# Patient Record
Sex: Female | Born: 1965 | Race: White | Hispanic: No | Marital: Married | Smoking: Never smoker
Health system: Southern US, Community
[De-identification: ages and names within clinical notes are randomized; demographics above are authoritative.]

## PROBLEM LIST (undated history)

## (undated) DIAGNOSIS — F419 Anxiety disorder, unspecified: Secondary | ICD-10-CM

## (undated) DIAGNOSIS — F32A Depression, unspecified: Secondary | ICD-10-CM

## (undated) DIAGNOSIS — T7840XA Allergy, unspecified, initial encounter: Secondary | ICD-10-CM

## (undated) DIAGNOSIS — E119 Type 2 diabetes mellitus without complications: Secondary | ICD-10-CM

## (undated) DIAGNOSIS — Z8042 Family history of malignant neoplasm of prostate: Secondary | ICD-10-CM

## (undated) DIAGNOSIS — I10 Essential (primary) hypertension: Secondary | ICD-10-CM

## (undated) DIAGNOSIS — Z803 Family history of malignant neoplasm of breast: Secondary | ICD-10-CM

## (undated) DIAGNOSIS — Z8049 Family history of malignant neoplasm of other genital organs: Secondary | ICD-10-CM

## (undated) DIAGNOSIS — F329 Major depressive disorder, single episode, unspecified: Secondary | ICD-10-CM

## (undated) DIAGNOSIS — G4730 Sleep apnea, unspecified: Secondary | ICD-10-CM

## (undated) DIAGNOSIS — Z808 Family history of malignant neoplasm of other organs or systems: Secondary | ICD-10-CM

## (undated) DIAGNOSIS — R7303 Prediabetes: Secondary | ICD-10-CM

## (undated) DIAGNOSIS — E785 Hyperlipidemia, unspecified: Secondary | ICD-10-CM

## (undated) DIAGNOSIS — G43909 Migraine, unspecified, not intractable, without status migrainosus: Secondary | ICD-10-CM

## (undated) DIAGNOSIS — K219 Gastro-esophageal reflux disease without esophagitis: Secondary | ICD-10-CM

## (undated) HISTORY — DX: Family history of malignant neoplasm of other genital organs: Z80.49

## (undated) HISTORY — PX: CHOLECYSTECTOMY: SHX55

## (undated) HISTORY — PX: TUBAL LIGATION: SHX77

## (undated) HISTORY — DX: Depression, unspecified: F32.A

## (undated) HISTORY — DX: Prediabetes: R73.03

## (undated) HISTORY — DX: Major depressive disorder, single episode, unspecified: F32.9

## (undated) HISTORY — DX: Allergy, unspecified, initial encounter: T78.40XA

## (undated) HISTORY — PX: COLONOSCOPY: SHX174

## (undated) HISTORY — DX: Sleep apnea, unspecified: G47.30

## (undated) HISTORY — DX: Gastro-esophageal reflux disease without esophagitis: K21.9

## (undated) HISTORY — DX: Family history of malignant neoplasm of breast: Z80.3

## (undated) HISTORY — DX: Family history of malignant neoplasm of prostate: Z80.42

## (undated) HISTORY — PX: CARPAL TUNNEL RELEASE: SHX101

## (undated) HISTORY — DX: Family history of malignant neoplasm of other organs or systems: Z80.8

## (undated) HISTORY — PX: FOOT BONE EXCISION: SUR493

## (undated) HISTORY — DX: Migraine, unspecified, not intractable, without status migrainosus: G43.909

## (undated) HISTORY — DX: Hyperlipidemia, unspecified: E78.5

## (undated) HISTORY — DX: Type 2 diabetes mellitus without complications: E11.9

---

## 1999-01-25 ENCOUNTER — Other Ambulatory Visit: Admission: RE | Admit: 1999-01-25 | Discharge: 1999-01-25 | Payer: Self-pay | Admitting: Obstetrics and Gynecology

## 1999-08-09 ENCOUNTER — Other Ambulatory Visit: Admission: RE | Admit: 1999-08-09 | Discharge: 1999-08-09 | Payer: Self-pay | Admitting: Podiatry

## 2000-02-15 ENCOUNTER — Other Ambulatory Visit: Admission: RE | Admit: 2000-02-15 | Discharge: 2000-02-15 | Payer: Self-pay | Admitting: Obstetrics and Gynecology

## 2001-05-21 ENCOUNTER — Other Ambulatory Visit: Admission: RE | Admit: 2001-05-21 | Discharge: 2001-05-21 | Payer: Self-pay | Admitting: Obstetrics and Gynecology

## 2003-11-16 ENCOUNTER — Encounter: Admission: RE | Admit: 2003-11-16 | Discharge: 2003-11-16 | Payer: Self-pay | Admitting: Family Medicine

## 2004-06-06 ENCOUNTER — Other Ambulatory Visit: Admission: RE | Admit: 2004-06-06 | Discharge: 2004-06-06 | Payer: Self-pay | Admitting: Obstetrics and Gynecology

## 2006-02-02 ENCOUNTER — Other Ambulatory Visit: Admission: RE | Admit: 2006-02-02 | Discharge: 2006-02-02 | Payer: Self-pay | Admitting: Obstetrics and Gynecology

## 2009-04-30 ENCOUNTER — Encounter: Admission: RE | Admit: 2009-04-30 | Discharge: 2009-04-30 | Payer: Self-pay | Admitting: Family Medicine

## 2011-08-22 ENCOUNTER — Ambulatory Visit: Payer: Self-pay | Admitting: Dietician

## 2011-09-01 ENCOUNTER — Encounter: Payer: 59 | Attending: Family Medicine | Admitting: Dietician

## 2011-09-01 ENCOUNTER — Encounter: Payer: Self-pay | Admitting: Dietician

## 2011-09-01 DIAGNOSIS — E119 Type 2 diabetes mellitus without complications: Secondary | ICD-10-CM | POA: Insufficient documentation

## 2011-09-01 DIAGNOSIS — E7800 Pure hypercholesterolemia, unspecified: Secondary | ICD-10-CM | POA: Insufficient documentation

## 2011-09-01 DIAGNOSIS — E669 Obesity, unspecified: Secondary | ICD-10-CM | POA: Insufficient documentation

## 2011-09-01 DIAGNOSIS — Z713 Dietary counseling and surveillance: Secondary | ICD-10-CM | POA: Insufficient documentation

## 2011-09-01 NOTE — Patient Instructions (Signed)
   Try to walk for 30 minutes on Saturday and Sunday when you are off.  When you have adjusted to your new schedule, aim to add some walking time into the weekdays.  Try to do the whole grain breads and cereals.    Read food labels and aim for 2 gm of fiber in bread servings and 3 gm of fiber in your cereals, rice and pasta.  Keep the sugar level in cereals 0-9 gms.  Monitor fat portions and use the amounts suggested on the label.  Increase your intake of non-starchy vegetables.    Make the meats and proteins lean.  Mealtime servings are the size of the palm of your hand.  Continue to bake, broil and avoid frying foods.  Try to limit carbohydrates to 1 serving for snacks and 3 servings at meals or 45 gm.  Use the exchange list and the food label for counting.  Plan to have a protein serving at all meals and snacks. (Snack can be 1-2 oz of protein)  Monitor blood glucose levels daily (Yariana Hoaglund have to skip a couple of days since you do not have but 9 strips.  Call your insurance company and see what their preferred meter is.    If the preferred meter is not the Accu-Chek, then find out the preferred brand and check with Maggie to see if she has that meter.  When you see Dr. Doristine Counter, take the meter and your glucose log.  Plan to get him to write you a prescription for glucose strips and lancets.  Call if you have questions ([phone])    Follow-up in 8-12 weeks with me.  Call [phone] to set-up an appointment.

## 2011-09-01 NOTE — Progress Notes (Signed)
  Medical Nutrition Therapy:  Appt start time: 0830 end time:  1000.  Assessment:  Primary concerns today: new onset type 2 diabetes.  HgA1C is 6.8% on 06/22/2011.  Has strong family history of diabetes (both parents)  Currently on no diabetes medications but is trying to loose weight.  Has lost 9.4 lbs since her last appointment with MD.  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  She has given up her regular soda but is having difficulty giving up her sweetened tea.  Her allergies are food in nature.  They are rosemary and Aspartane.  She gets sever headaches when she uses the Aspartane.   MEDICATIONS: See review  DIETARY INTAKE:  24-hr recall:  B (8:30  AM): biscuit and 2 slices of bacon and glass of sweet tea  Snk (AM) :None Sleeps during the day.  D (6:30-7:00 PM): Grill meat such as chicken (3 oz), mac and cheese (1/2 cup), squash and onions (1/2 cup) and sweet tea (8 oz). L (4:00 AM): Left-overs from dinner at home.  Chicken 3 oz, mac and cheese 1/4 cup and Squash nd onion 1/2 cup, and 4-6 oz of sweet tea.  Snk (2-2:30 PM): Water of sometimes some fruit.  Recent physical activity: Currently no regular exercise plan.  Encouraged her to start at least walking 30 minutes on her day off.  Estimated energy needs: 1300-1400 calories 150-155 g carbohydrates 100-105 g protein 36-38 g fat  Progress Towards Goal(s):  In progress.   Nutritional Diagnosis:  [place]-2.1 Inpaired nutrition utilization As related to glucose.  As evidenced by diagnosis of type 2 diabetes and HgA1C of 6.8%.    Intervention:  Nutrition Currently adjusting to the new working schedule and its change on her lifestyle.  It has impacted on her diet in that she is less hungry, and finds herself nauseated in the early morning hours at work and eats less at the dinner break.  Handouts given during visit include:  Diabetes Type 2, What it is and basic  self-management measures.  Yellow card with dietary prescription.  Novo Nordisk Carb counting guide  Self-management guide for diet, exercise, glucose monitoring.  Monitoring/Evaluation:  Dietary intake, exercise, blood glucose levels, and body weight 4-8 weeks after appointment with MD.

## 2012-12-25 ENCOUNTER — Other Ambulatory Visit: Payer: Self-pay | Admitting: Emergency Medicine

## 2012-12-25 ENCOUNTER — Ambulatory Visit
Admission: RE | Admit: 2012-12-25 | Discharge: 2012-12-25 | Disposition: A | Discharge status: Home / Self Care | Payer: Self-pay | Source: Ambulatory Visit | Attending: Emergency Medicine | Admitting: Emergency Medicine

## 2012-12-25 DIAGNOSIS — R52 Pain, unspecified: Secondary | ICD-10-CM

## 2012-12-25 DIAGNOSIS — W19XXXA Unspecified fall, initial encounter: Secondary | ICD-10-CM

## 2013-11-21 ENCOUNTER — Emergency Department (HOSPITAL_COMMUNITY)
Admission: EM | Admit: 2013-11-21 | Discharge: 2013-11-21 | Disposition: A | Discharge status: Home / Self Care | Payer: Worker's Compensation | Attending: Emergency Medicine | Admitting: Emergency Medicine

## 2013-11-21 ENCOUNTER — Emergency Department (HOSPITAL_COMMUNITY): Payer: Worker's Compensation

## 2013-11-21 ENCOUNTER — Encounter (HOSPITAL_COMMUNITY): Payer: Self-pay | Admitting: Emergency Medicine

## 2013-11-21 DIAGNOSIS — S42202A Unspecified fracture of upper end of left humerus, initial encounter for closed fracture: Secondary | ICD-10-CM

## 2013-11-21 DIAGNOSIS — S42209A Unspecified fracture of upper end of unspecified humerus, initial encounter for closed fracture: Secondary | ICD-10-CM | POA: Insufficient documentation

## 2013-11-21 DIAGNOSIS — S59909A Unspecified injury of unspecified elbow, initial encounter: Secondary | ICD-10-CM | POA: Insufficient documentation

## 2013-11-21 DIAGNOSIS — Y9289 Other specified places as the place of occurrence of the external cause: Secondary | ICD-10-CM | POA: Insufficient documentation

## 2013-11-21 DIAGNOSIS — W010XXA Fall on same level from slipping, tripping and stumbling without subsequent striking against object, initial encounter: Secondary | ICD-10-CM | POA: Insufficient documentation

## 2013-11-21 DIAGNOSIS — Z79899 Other long term (current) drug therapy: Secondary | ICD-10-CM | POA: Insufficient documentation

## 2013-11-21 DIAGNOSIS — S6990XA Unspecified injury of unspecified wrist, hand and finger(s), initial encounter: Secondary | ICD-10-CM | POA: Insufficient documentation

## 2013-11-21 DIAGNOSIS — Y9389 Activity, other specified: Secondary | ICD-10-CM | POA: Insufficient documentation

## 2013-11-21 DIAGNOSIS — Y990 Civilian activity done for income or pay: Secondary | ICD-10-CM | POA: Insufficient documentation

## 2013-11-21 HISTORY — DX: Anxiety disorder, unspecified: F41.9

## 2013-11-21 HISTORY — DX: Essential (primary) hypertension: I10

## 2013-11-21 MED ORDER — OXYCODONE-ACETAMINOPHEN 5-325 MG PO TABS: 2.0000 | ORAL_TABLET | ORAL | Status: DC | PRN

## 2013-11-21 MED ORDER — OXYCODONE-ACETAMINOPHEN 5-325 MG PO TABS
2.0000 | ORAL_TABLET | Freq: Once | ORAL | Status: AC
  Administered 2013-11-21: 2 via ORAL
  Filled 2013-11-21: qty 2

## 2013-11-21 MED ORDER — MORPHINE SULFATE 4 MG/ML IJ SOLN: 4.0000 mg | INTRAMUSCULAR | Status: DC | PRN

## 2013-11-21 MED ORDER — MORPHINE SULFATE 4 MG/ML IJ SOLN: 4.0000 mg | Freq: Once | INTRAMUSCULAR | Status: DC

## 2013-11-21 MED ORDER — ONDANSETRON 4 MG PO TBDP: 4.0000 mg | ORAL_TABLET | Freq: Three times a day (TID) | ORAL | Status: DC | PRN

## 2013-11-21 MED ORDER — ONDANSETRON HCL 4 MG PO TABS
4.0000 mg | ORAL_TABLET | Freq: Once | ORAL | Status: AC
  Administered 2013-11-21: 4 mg via ORAL
  Filled 2013-11-21: qty 1

## 2013-11-21 MED ORDER — ONDANSETRON HCL 4 MG/2ML IJ SOLN: 4.0000 mg | Freq: Once | INTRAMUSCULAR | Status: DC

## 2013-11-21 MED ORDER — MORPHINE SULFATE 4 MG/ML IJ SOLN
8.0000 mg | Freq: Once | INTRAMUSCULAR | Status: AC
  Administered 2013-11-21: 8 mg via INTRAMUSCULAR
  Filled 2013-11-21: qty 2

## 2013-11-21 MED ORDER — MECLIZINE HCL 50 MG PO TABS: 50.0000 mg | ORAL_TABLET | Freq: Three times a day (TID) | ORAL | Status: DC | PRN

## 2013-11-21 NOTE — ED Notes (Signed)
Patient transported to X-ray via stretcher 

## 2013-11-21 NOTE — Consult Note (Signed)
ORTHOPAEDIC CONSULTATION  REQUESTING PHYSICIAN: Emergency room physician  Chief Complaint: Left arm pain  HPI: Morgan Rivera is a 47 y.o. female who complains of  left arm pain after a mechanical fall. She said she just tripped and fell. No other injuries. Acute severe pain of the left shoulder, better with IV medications, worse with movement. This occurred today.  Past Medical History  Diagnosis Date  . Hypertension   . Anxiety    Past Surgical History  Procedure Laterality Date  . Cholecystectomy    . Carpal tunnel release    . Foot bone excision     History   Social History  . Marital Status: Married    Spouse Name: N/A    Number of Children: N/A  . Years of Education: N/A   Social History Main Topics  . Smoking status: Never Smoker   . Smokeless tobacco: Never Used  . Alcohol Use: No  . Drug Use: No  . Sexual Activity: Yes    Birth Control/ Protection: Surgical   Other Topics Concern  . None   Social History Narrative  . None   History reviewed. No pertinent family history. Allergies  Allergen Reactions  . Food     Rosemary, scratchy sore throat    . Hydrocodone     Vertigo, nausea, vomiting   Prior to Admission medications   Medication Sig Start Date End Date Taking? Authorizing Provider  citalopram (CELEXA) 40 MG tablet Take 40 mg by mouth daily.   Yes Historical Provider, MD  quinapril (ACCUPRIL) 40 MG tablet Take 40 mg by mouth at bedtime.     Yes Historical Provider, MD  meclizine (ANTIVERT) 50 MG tablet Take 1 tablet (50 mg total) by mouth 3 (three) times daily as needed. 11/21/13   Roney Marion, MD  ondansetron (ZOFRAN ODT) 4 MG disintegrating tablet Take 1 tablet (4 mg total) by mouth every 8 (eight) hours as needed for nausea. 11/21/13   Roney Marion, MD  oxyCODONE-acetaminophen (PERCOCET/ROXICET) 5-325 MG per tablet Take 2 tablets by mouth every 4 (four) hours as needed. 11/21/13   Roney Marion, MD   Dg Shoulder Left  11/21/2013   CLINICAL  DATA:  Fall  EXAM: LEFT SHOULDER - 2+ VIEW  COMPARISON:  Concomitant radiograph of the left humerus.  FINDINGS: Acute comminuted fracture of the proximal left humeral shaft is partially visualized, better evaluated on concomitant radiograph of the left humerus. The humeral head remains in normal alignment with the glenoid. AC joint is approximated. Small periarticular calcifications seen at the superior aspect of the humeral head may represent sequelae of calcific tendinosis.  IMPRESSION: Comminuted fracture of the proximal left humeral shaft, better evaluated on concomitant radiograph of the left humerus. No shoulder dislocation.   Electronically Signed   By: Rise Mu M.D.   On: 11/21/2013 06:21   Dg Humerus Left  11/21/2013   CLINICAL DATA:  Fall  EXAM: LEFT HUMERUS - 2+ VIEW  COMPARISON:  Concomitant radiograph of the left shoulder.  FINDINGS: There is a comminuted fracture of the proximal left humeral shaft with slight medial and posterior displacement. Diffuse soft tissue swelling is present. No radiopaque foreign body. Osseous mineralization is normal.  IMPRESSION: Comminuted mildly displaced fracture of the proximal left humeral shaft.   Electronically Signed   By: Rise Mu M.D.   On: 11/21/2013 06:23    Positive ROS: All other systems have been reviewed and were otherwise negative with the exception of those  mentioned in the HPI and as above.  Physical Exam: General: Alert, no acute distress Cardiovascular: No pedal edema, positive obesity. Respiratory: No cyanosis, no use of accessory musculature GI: No organomegaly, abdomen is soft and non-tender Skin: No lesions in the area of chief complaint with the exception of bruising per report. She is in a coaptation splint now. Neurologic: Sensation intact distally, mild decreased sensation on the tip of the index finger, sensation intact the rest of the hand. Psychiatric: Patient is competent for consent with normal mood and  affect Lymphatic: No axillary or cervical lymphadenopathy  MUSCULOSKELETAL: Left hand has extension intact, all fingers flex extend and abduct. Positive pain to palpation of the proximal humerus. No pain of the elbow.  Assessment: Left proximal humeral shaft fracture, minimally displaced, with coexisting obesity.  Plan: The obesity makes it more challenging to maintain adequate reduction, however the degree of displacement is currently acceptable, and I recommended closed treatment. Victorio Palm, orthopedic PA-C has assisted the cast techs with the application of a coaptation splint. She will have Percocet for pain going home, and I'll plan to see her again in one week. This is an acute severe injury that could take probably at least for 6 months to recover from, and I will monitor the position of the fracture closely over the course of the next couple of weeks, to make sure that maintain alignment. If not then she may need surgical intervention.     Eulas Post, MD Cell [phone]   11/21/2013 9:26 AM

## 2013-11-21 NOTE — ED Provider Notes (Signed)
CSN: [id number]     Arrival date & time 11/21/13  0451 History   First MD Initiated Contact with Patient 11/21/13 0451     Chief Complaint  Patient presents with  . Fall   (Consider location/radiation/quality/duration/timing/severity/associated sxs/prior Treatment) Patient is a 47 y.o. female presenting with fall. The history is provided by the patient.  Fall   patient here complaining of severe left shoulder pain after tripping and falling this morning. No loss of consciousness. Denies any head or neck injury. Complains of severe sharp pain worse with movement to her leftt anterior and posterior shoulder. Also notes a soreness to the left wrist and forearm and elbow. No numbness or tingling or neurological deficits of the left side. Called EMS and was given Zofran for nausea and transported here.  No past medical history on file. No past surgical history on file. No family history on file. History  Substance Use Topics  . Smoking status: Never Smoker   . Smokeless tobacco: Never Used  . Alcohol Use: No   OB History   Grav Para Term Preterm Abortions TAB SAB Ect Mult Living                 Review of Systems  All other systems reviewed and are negative.    Allergies  Food  Home Medications   Current Outpatient Rx  Name  Route  Sig  Dispense  Refill  . alprazolam (XANAX) 2 MG tablet   Oral   Take 2 mg by mouth at bedtime as needed.           . quinapril (ACCUPRIL) 40 MG tablet   Oral   Take 40 mg by mouth at bedtime.           . rosuvastatin (CRESTOR) 20 MG tablet   Oral   Take 20 mg by mouth daily.            There were no vitals taken for this visit. Physical Exam  Nursing note and vitals reviewed. Constitutional: She is oriented to person, place, and time. She appears well-developed and well-nourished.  Non-toxic appearance. No distress.  HENT:  Head: Normocephalic and atraumatic.  Eyes: Conjunctivae, EOM and lids are normal. Pupils are equal, round,  and reactive to light.  Neck: Normal range of motion. Neck supple. No tracheal deviation present. No mass present.  Cardiovascular: Normal rate, regular rhythm and normal heart sounds.  Exam reveals no gallop.   No murmur heard. Pulmonary/Chest: Effort normal and breath sounds normal. No stridor. No respiratory distress. She has no decreased breath sounds. She has no wheezes. She has no rhonchi. She has no rales.  Abdominal: Soft. Normal appearance and bowel sounds are normal. She exhibits no distension. There is no tenderness. There is no rebound and no CVA tenderness.  Musculoskeletal: Normal range of motion. She exhibits no edema and no tenderness.       Left shoulder: She exhibits bony tenderness, swelling and pain.       Arms: Neurological: She is alert and oriented to person, place, and time. She has normal strength. No cranial nerve deficit or sensory deficit. GCS eye subscore is 4. GCS verbal subscore is 5. GCS motor subscore is 6.  Skin: Skin is warm and dry. No abrasion and no rash noted.  Psychiatric: She has a normal mood and affect. Her speech is normal and behavior is normal.    ED Course  Procedures (including critical care time) Labs Review Labs Reviewed - No  data to display Imaging Review No results found.  EKG Interpretation   None       MDM  No diagnosis found. Spoke with dr. Dion Saucier, will come to see this am--pain meds given,    Toy Baker, MD 11/21/13 [phone]

## 2013-11-21 NOTE — ED Notes (Signed)
Ortho PA at bedside.  

## 2013-11-21 NOTE — ED Notes (Signed)
Fell at work landing on left arm.  C/O left shoulder area pain with even slightest movement.  Denies any other injuries

## 2013-11-21 NOTE — Discharge Instructions (Signed)
Humerus Fracture, Treated with Immobilization °The humerus is the large bone in the upper arm. A broken (fractured) humerus is often treated by wearing a cast, splint, or sling (immobilization). This holds the broken pieces in place so they can heal.  °HOME CARE °· Put ice on the injured area. °· Put ice in a plastic bag. °· Place a towel between your skin and the bag. °· Leave the ice on for 15-20 minutes, 03-04 times a day. °· If you are given a cast: °· Do not scratch the skin under the cast. °· Check the skin around the cast every day. You may put lotion on any red or sore areas. °· Keep the cast dry and clean. °· If you are given a splint: °· Wear the splint as told. °· Keep the splint clean and dry. °· Loosen the elastic around the splint if your fingers become numb, cold, tingle, or turn blue. °· If you are given a sling: °· Wear the sling as told. °· Do not put pressure on any part of the cast or splint until it is fully hardened. °· The cast or splint must be protected with a plastic bag during bathing. Do not lower the cast or splint into water. °· Only take medicine as told by your doctor. °· Do exercises as told by your doctor. °· Follow up as told by your doctor. °GET HELP RIGHT AWAY IF:  °· Your skin or fingernails turn blue or gray. °· Your arm feels cold or numb. °· You have very bad pain in the injured arm. °· You are having problems with the medicines you were given. °MAKE SURE YOU:  °· Understand these instructions. °· Will watch your condition. °· Will get help right away if you are not doing well or get worse. °Document Released: 05/22/2008 Document Revised: 02/26/2012 Document Reviewed: 01/18/2011 °ExitCare® Patient Information ©2014 [employer]. ° °

## 2014-05-06 ENCOUNTER — Other Ambulatory Visit (INDEPENDENT_AMBULATORY_CARE_PROVIDER_SITE_OTHER): Payer: Self-pay

## 2014-05-06 ENCOUNTER — Encounter (INDEPENDENT_AMBULATORY_CARE_PROVIDER_SITE_OTHER): Payer: Self-pay | Admitting: Surgery

## 2014-05-06 ENCOUNTER — Ambulatory Visit (INDEPENDENT_AMBULATORY_CARE_PROVIDER_SITE_OTHER): Payer: 59 | Admitting: Surgery

## 2014-05-06 NOTE — Progress Notes (Signed)
Re:   Morgan QuinceSandra C Vaca DOB:   17-Jun-1966 MRN:   [id number]  ASSESSMENT AND PLAN: 1.  Morbid obesity  Initial weight - 265, BMI - 50.18  Per the 1991 NIH Consensus Statement, the patient is a candidate for bariatric surgery.  The patient attended our initial information session and reviewed the types of bariatric surgery.    The patient is interested in the Roux en Y Gastric Bypass.  I discussed with the patient the indications and risks of bariatric surgery.  The potential risks of surgery include, but are not limited to, bleeding, infection, leak from the bowel, DVT and PE, open surgery, long term nutrition consequences, and death.  The patient understands the importance of compliance and long term follow-up with our group after surgery.  From here we will obtain lab tests, x-rays, nutrition consult, and psych consult.  2.  HTN 3.  Anxiety 4.  Sleep apnea 5.  Recovering from broken upper left arm. 6.  GERD  Chief Complaint  Patient presents with  . Bariatric Pre-op    Initial consult for gastric bypass   REFERRING PHYSICIAN: Millsaps, Joelene MillinKIMBERLY M, NP  [facility])  HISTORY OF PRESENT ILLNESS: Morgan Rivera is a 48 y.o. (DOB: 17-Jun-1966)  white  female whose primary care physician is Millsaps, Joelene MillinKIMBERLY M, NP and comes to me today for weight loss surgery. Her friend, Cristine PolioSteve Lamb, is with her.  She went to the online seminar for her information.  There is someone at work that she knows he's had a successful gastric bypass. 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 Cletus GashFoss Fernandez who did the gastric bypass surgery.  She has tried [employer], [facility] diet, Slim TRIM, counting calories, hold unsustained weight loss. She also tried medications Belviq and Qsymia without success.   Past Medical History  Diagnosis Date  . Hypertension   . Anxiety      Past Surgical History  Procedure Laterality Date  . Cholecystectomy    . Carpal tunnel release    .  Foot bone excision        Current Outpatient Prescriptions  Medication Sig Dispense Refill  . citalopram (CELEXA) 40 MG tablet Take 40 mg by mouth daily.      . meclizine (ANTIVERT) 50 MG tablet Take 1 tablet (50 mg total) by mouth 3 (three) times daily as needed.  30 tablet  0  . quinapril (ACCUPRIL) 40 MG tablet Take 40 mg by mouth at bedtime.        . ondansetron (ZOFRAN ODT) 4 MG disintegrating tablet Take 1 tablet (4 mg total) by mouth every 8 (eight) hours as needed for nausea.  20 tablet  0   No current facility-administered medications for this visit.     Allergies  Allergen Reactions  . Food     Rosemary, scratchy sore throat    . Hydrocodone     Vertigo, nausea, vomiting    REVIEW OF SYSTEMS: Skin:  No history of rash.  No history of abnormal moles. Infection:  No history of hepatitis or HIV.  No history of MRSA. Neurologic:  No history of stroke.  No history of seizure.  No history of headaches. Cardiac:  Hypertension. No history of heart disease.  No history of prior cardiac catheterization.  No history of seeing a cardiologist. Pulmonary:  [facility] x 4 years.  Dr. Morrie Sheldonay at [place] sees her.   Does not smoke cigarettes.  No asthma or  bronchitis.    Endocrine:  Pre diabetic.  She said the her HgbA1C is 6.7.  No thyroid disease. Gastrointestinal:  No history of stomach disease. Has GERD.  No history of liver disease.  History of cholecystectomy in 2006 in [place].   No history of pancreas disease.  No history of colon disease.  She did [place] in about [place], but does not know who did it. Urologic:  No history of kidney stones.  No history of bladder infections. GYN:  Sees Dr. Myrene GalasJ. Tomblin.  She has had a tubal ligation and laparoscopy for endometriosis Musculoskeletal:  Broke left upper arm.  Sees Dr.Landau.  Is out of work until September 2014. Hematologic:  No bleeding disorder.  No history of anemia.  Not anticoagulated. Psycho-social:  The patient is  oriented.   The patient has no obvious psychologic or social impairment to understanding our conversation and plan.  SOCIAL and FAMILY HISTORY: Not married, but almost. Cristine PolioSteve Lamb is with her. She has one daughter - 50 yo. 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  She sometimes has to lift 50 pounds.  [employer] is being sold to [employer] and she is worried about her job.    PHYSICAL EXAM: BP 138/82  Pulse 78  Temp(Src) 97.8 F (36.6 C) (Temporal)  Resp 16  Ht 5\' 1"  (1.549 m)  Wt 265 lb 9.6 oz (120.475 kg)  BMI 50.21 kg/m2  General: Obese WF who is alert and generally healthy appearing.  HEENT: Normal. Pupils equal. Neck: Supple. No mass.  No thyroid mass. Lymph Nodes:  No supraclavicular or cervical nodes. Lungs: Clear to auscultation and symmetric breath sounds. Heart:  RRR. No murmur or rub. Breasts:  Right:  Large, but no mass  Left:  Large, but no mass Abdomen: Soft. No mass. No tenderness. No hernia. Normal bowel sounds.  She has a large abdomen, but still about 1/2 apple and 1/2 pear. Rectal: No mass.  Guaiac neg. Extremities:  Good strength and ROM  in upper and lower extremities. Neurologic:  Grossly intact to motor and sensory function. Psychiatric: Has normal mood and affect. Behavior is normal.   DATA REVIEWED: Epic notes  Ovidio Kinavid Avira Tillison, MD,  [facility] [address]  [address]   [place], [place]    [id number] Phone:  [phone] FAX:  [phone]

## 2014-05-20 ENCOUNTER — Encounter: Payer: Self-pay | Admitting: Dietician

## 2014-05-20 ENCOUNTER — Encounter: Payer: 59 | Attending: Surgery | Admitting: Dietician

## 2014-05-20 DIAGNOSIS — Z6841 Body Mass Index (BMI) 40.0 and over, adult: Secondary | ICD-10-CM | POA: Insufficient documentation

## 2014-05-20 DIAGNOSIS — Z713 Dietary counseling and surveillance: Secondary | ICD-10-CM | POA: Insufficient documentation

## 2014-05-20 NOTE — Progress Notes (Signed)
  Pre-Op Assessment Visit:  Pre-Operative RYGB Surgery  Medical Nutrition Therapy:  Appt start time: 0800   End time:  0845.  Patient was seen on 05/20/2014 for Pre-Operative RYGB Nutrition Assessment. Assessment and letter of approval faxed to [facility] Bariatric Surgery Program coordinator on 05/20/2014.   Preferred Learning Style:  No preference indicated   Learning Readiness:   Ready   Handouts given during visit include:  Pre-Op Goals Bariatric Surgery Protein Shakes Phase 3A lean protein (per pt request)   Samples given and patient instructed on proper use:  Premier protein (chocolate - qty 1) Lot#: [id number] Exp: 12/2014  Unjury protein powder (unflavored - qty 1) Lot #: [id number] Exp: 03/2015  Teaching Method Utilized:  Visual Auditory Hands on  Barriers to learning/adherence to lifestyle change: none  Demonstrated degree of understanding via:  Teach Back   Patient to call the [facility] to enroll in Pre-Op and Post-Op Nutrition Education when surgery date is scheduled.

## 2014-05-22 ENCOUNTER — Other Ambulatory Visit (INDEPENDENT_AMBULATORY_CARE_PROVIDER_SITE_OTHER): Payer: Self-pay

## 2014-05-25 ENCOUNTER — Encounter (HOSPITAL_COMMUNITY)
Admission: RE | Disposition: A | Discharge status: Home / Self Care | Payer: 59 | Source: Ambulatory Visit | Attending: Surgery

## 2014-05-25 ENCOUNTER — Ambulatory Visit (HOSPITAL_COMMUNITY)
Admission: RE | Admit: 2014-05-25 | Discharge: 2014-05-25 | Disposition: A | Discharge status: Home / Self Care | Payer: 59 | Source: Ambulatory Visit | Attending: Surgery | Admitting: Surgery

## 2014-05-25 ENCOUNTER — Encounter (HOSPITAL_COMMUNITY)
Admission: RE | Disposition: A | Discharge status: Home / Self Care | Payer: Self-pay | Source: Ambulatory Visit | Attending: Surgery

## 2014-05-25 DIAGNOSIS — E669 Obesity, unspecified: Secondary | ICD-10-CM | POA: Insufficient documentation

## 2014-05-25 HISTORY — PX: BREATH TEK H PYLORI: SHX5422

## 2014-05-25 SURGERY — BREATH TEST, FOR HELICOBACTER PYLORI

## 2014-05-25 NOTE — Progress Notes (Signed)
05/25/14 1323  BREATH TEK ASSESSMENT  Referring MD Dr Ezzard Standing  Time of Last PO Intake 2100  Baseline Breath At: 0809  Pranactin Given At: 0811  Post-Dose Breath At: 0826  Sample 1 4.5  Sample 2 3.0  Test Negative

## 2014-05-26 ENCOUNTER — Encounter (HOSPITAL_COMMUNITY): Payer: Self-pay | Admitting: Surgery

## 2014-06-02 ENCOUNTER — Ambulatory Visit (HOSPITAL_COMMUNITY)
Admission: RE | Admit: 2014-06-02 | Discharge: 2014-06-02 | Disposition: A | Discharge status: Home / Self Care | Payer: 59 | Source: Ambulatory Visit | Attending: Surgery | Admitting: Surgery

## 2014-06-02 DIAGNOSIS — K219 Gastro-esophageal reflux disease without esophagitis: Secondary | ICD-10-CM | POA: Insufficient documentation

## 2014-06-02 DIAGNOSIS — F411 Generalized anxiety disorder: Secondary | ICD-10-CM | POA: Insufficient documentation

## 2014-06-02 DIAGNOSIS — Z6841 Body Mass Index (BMI) 40.0 and over, adult: Secondary | ICD-10-CM | POA: Insufficient documentation

## 2014-06-02 DIAGNOSIS — I10 Essential (primary) hypertension: Secondary | ICD-10-CM | POA: Insufficient documentation

## 2014-06-02 DIAGNOSIS — G4730 Sleep apnea, unspecified: Secondary | ICD-10-CM | POA: Insufficient documentation

## 2014-06-02 DIAGNOSIS — Q393 Congenital stenosis and stricture of esophagus: Secondary | ICD-10-CM

## 2014-06-02 DIAGNOSIS — Q391 Atresia of esophagus with tracheo-esophageal fistula: Secondary | ICD-10-CM | POA: Insufficient documentation

## 2014-06-09 ENCOUNTER — Ambulatory Visit (HOSPITAL_COMMUNITY): Payer: 59

## 2014-06-18 LAB — CBC WITH DIFFERENTIAL/PLATELET
Basophils Absolute: 0 10*3/uL (ref 0.0–0.1)
Basophils Relative: 0 % (ref 0–1)
Eosinophils Absolute: 0.2 10*3/uL (ref 0.0–0.7)
Eosinophils Relative: 3 % (ref 0–5)
HCT: 35.5 % — ABNORMAL LOW (ref 36.0–46.0)
Hemoglobin: 12.3 g/dL (ref 12.0–15.0)
Lymphocytes Relative: 27 % (ref 12–46)
Lymphs Abs: 1.8 10*3/uL (ref 0.7–4.0)
MCH: 28.7 pg (ref 26.0–34.0)
MCHC: 34.6 g/dL (ref 30.0–36.0)
MCV: 82.9 fL (ref 78.0–100.0)
Monocytes Absolute: 0.5 10*3/uL (ref 0.1–1.0)
Monocytes Relative: 8 % (ref 3–12)
Neutro Abs: 4.2 10*3/uL (ref 1.7–7.7)
Neutrophils Relative %: 62 % (ref 43–77)
Platelets: 226 10*3/uL (ref 150–400)
RBC: 4.28 MIL/uL (ref 3.87–5.11)
RDW: 14.2 % (ref 11.5–15.5)
WBC: 6.8 10*3/uL (ref 4.0–10.5)

## 2014-06-18 LAB — COMPREHENSIVE METABOLIC PANEL
ALT: 15 U/L (ref 0–35)
AST: 13 U/L (ref 0–37)
Albumin: 3.7 g/dL (ref 3.5–5.2)
Alkaline Phosphatase: 63 U/L (ref 39–117)
BUN: 14 mg/dL (ref 6–23)
CO2: 29 mEq/L (ref 19–32)
Calcium: 8.9 mg/dL (ref 8.4–10.5)
Chloride: 103 mEq/L (ref 96–112)
Creat: 0.55 mg/dL (ref 0.50–1.10)
Glucose, Bld: 108 mg/dL — ABNORMAL HIGH (ref 70–99)
Potassium: 4.1 mEq/L (ref 3.5–5.3)
Sodium: 140 mEq/L (ref 135–145)
Total Bilirubin: 0.5 mg/dL (ref 0.2–1.2)
Total Protein: 6.1 g/dL (ref 6.0–8.3)

## 2014-06-18 LAB — TSH: TSH: 1.359 u[IU]/mL (ref 0.350–4.500)

## 2014-06-23 ENCOUNTER — Ambulatory Visit: Payer: Self-pay | Admitting: Dietician

## 2014-08-25 SURGERY — BREATH TEST, FOR HELICOBACTER PYLORI

## 2014-12-18 HISTORY — PX: SHOULDER SURGERY: SHX246

## 2015-07-06 IMAGING — CR DG SHOULDER 2+V*L*
3 series · 3 of 3 positions shown · non-contrast
Comparison: Concomitant radiograph of the left humerus.

CLINICAL DATA: Fall

EXAM:
LEFT SHOULDER - 2+ VIEW

[w shoulder ap internal left]
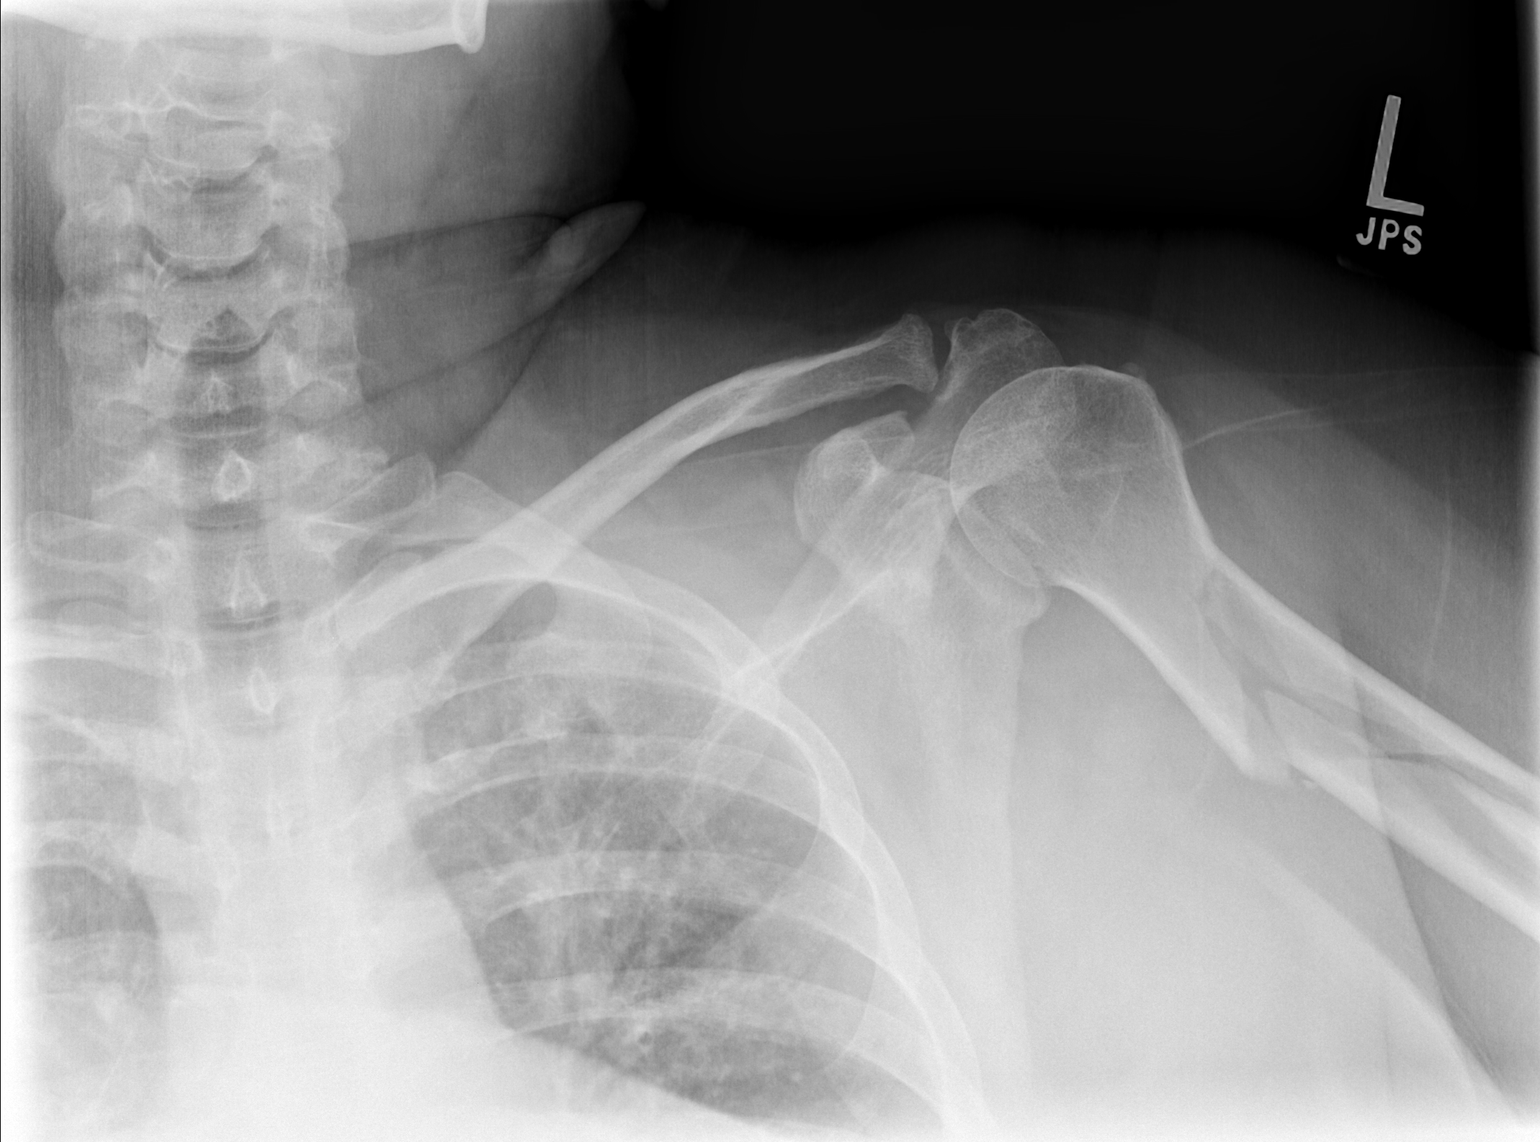

[w shoulder ap external left]
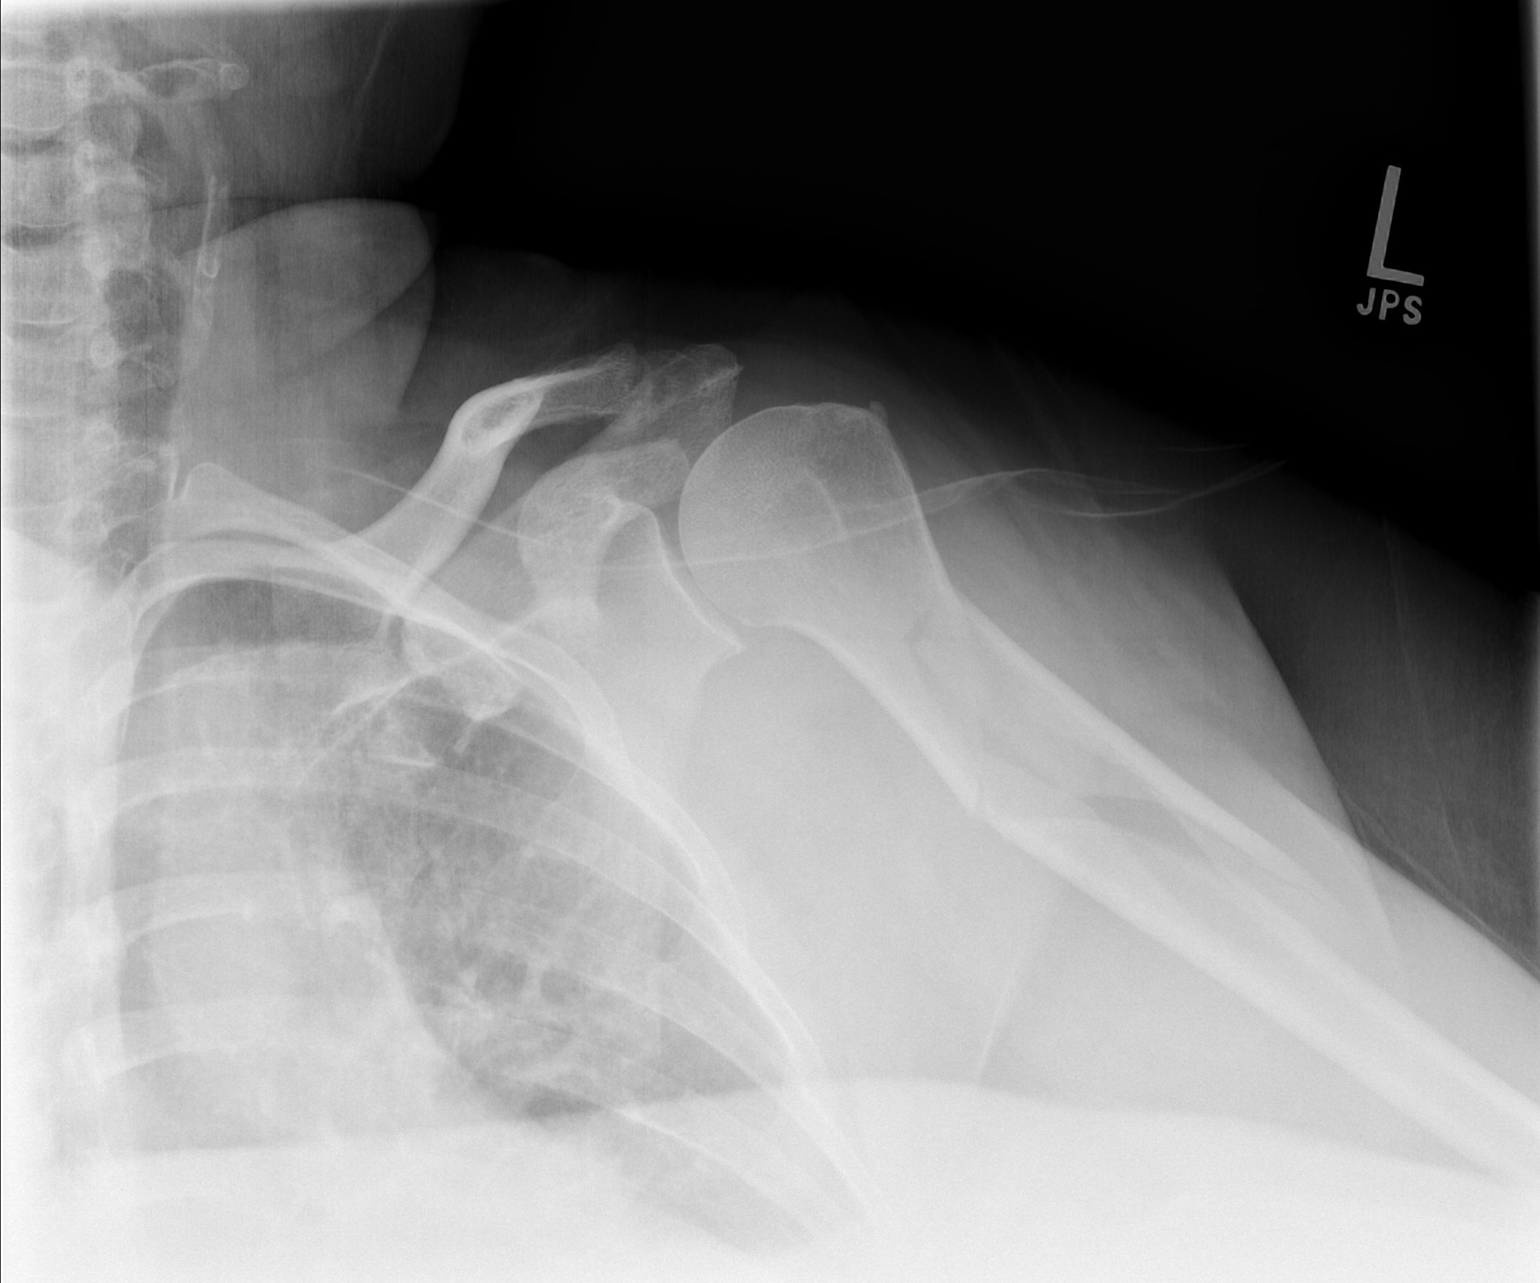

[w shoulder y view left]
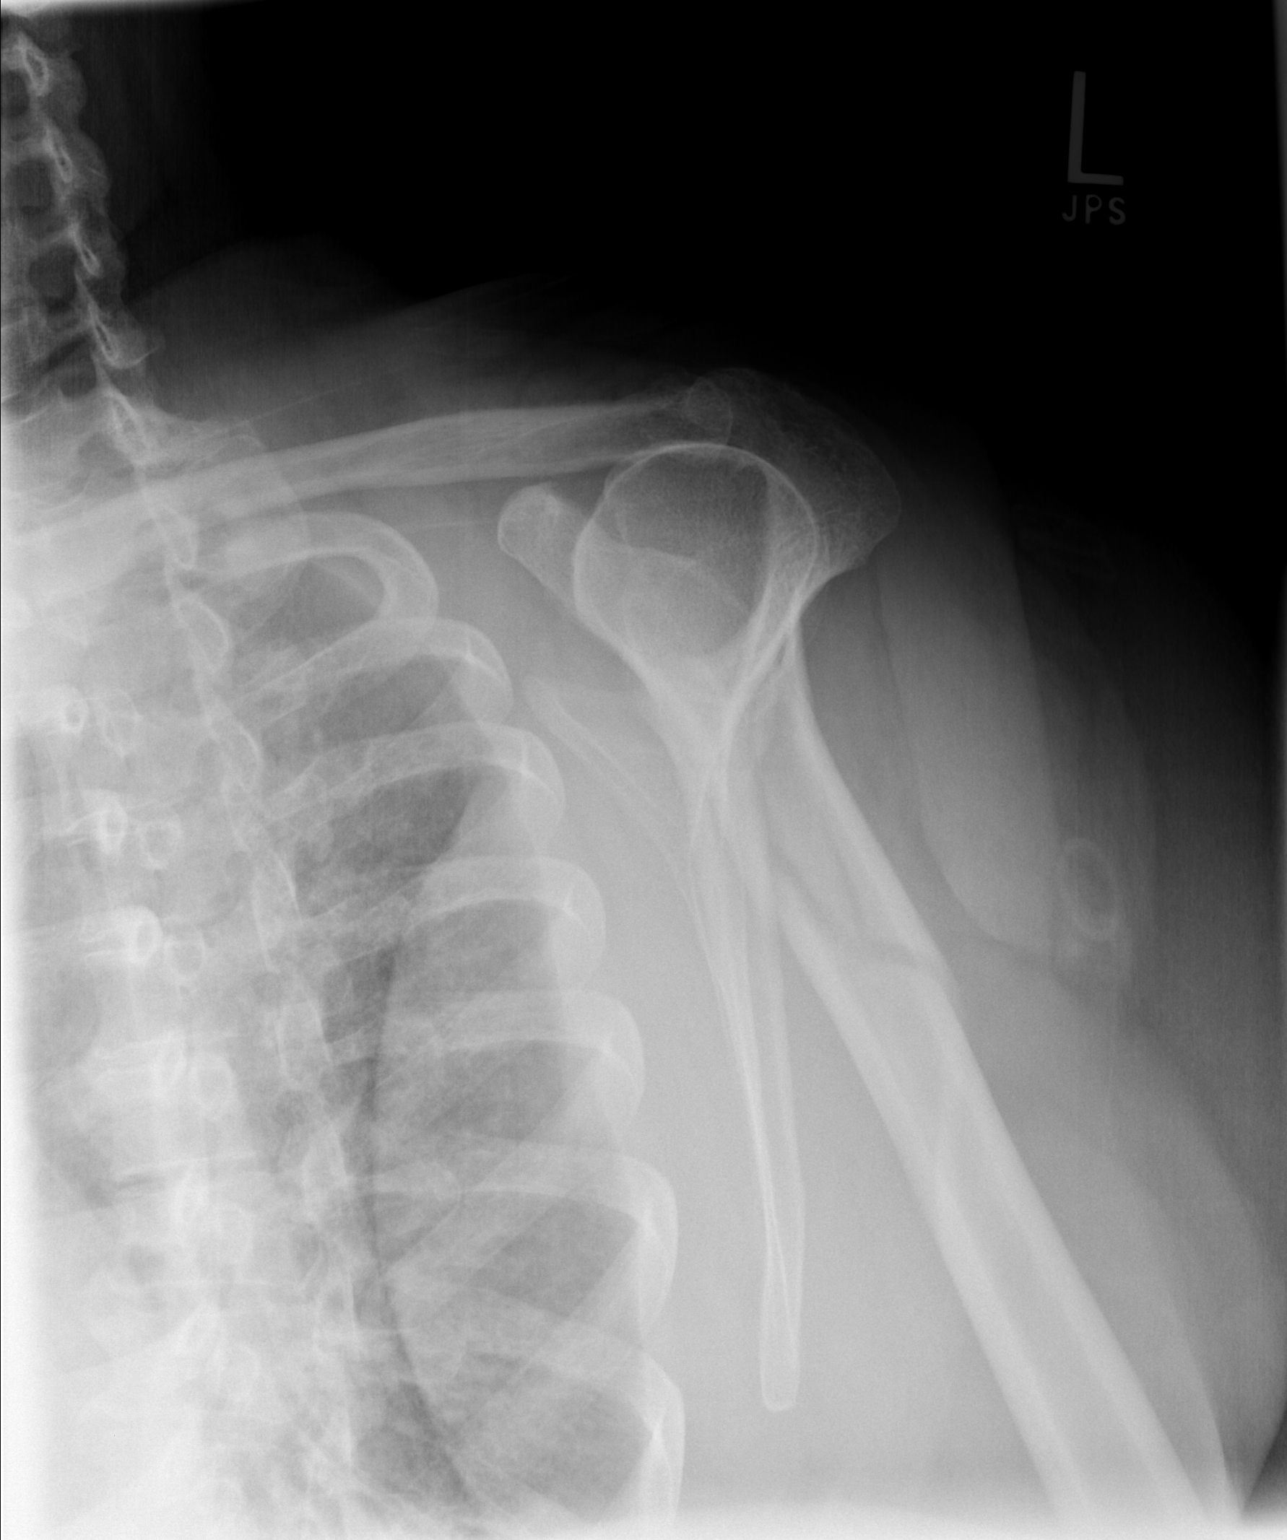

[3 of 3 positions shown; findings below may reference images not displayed]

FINDINGS: Acute comminuted fracture of the proximal left humeral shaft is
partially visualized, better evaluated on concomitant radiograph of
the left humerus. The humeral head remains in normal alignment with
the glenoid. AC joint is approximated. Small periarticular
calcifications seen at the superior aspect of the humeral head may
represent sequelae of calcific tendinosis.
IMPRESSION: Comminuted fracture of the proximal left humeral shaft, better
evaluated on concomitant radiograph of the left humerus. No shoulder
dislocation.

## 2015-07-06 IMAGING — CR DG HUMERUS 2V *L*
2 series · 2 of 2 positions shown · non-contrast
Comparison: Concomitant radiograph of the left shoulder.

CLINICAL DATA: Fall

EXAM:
LEFT HUMERUS - 2+ VIEW

[w humerus ap left * (1 of 2)]
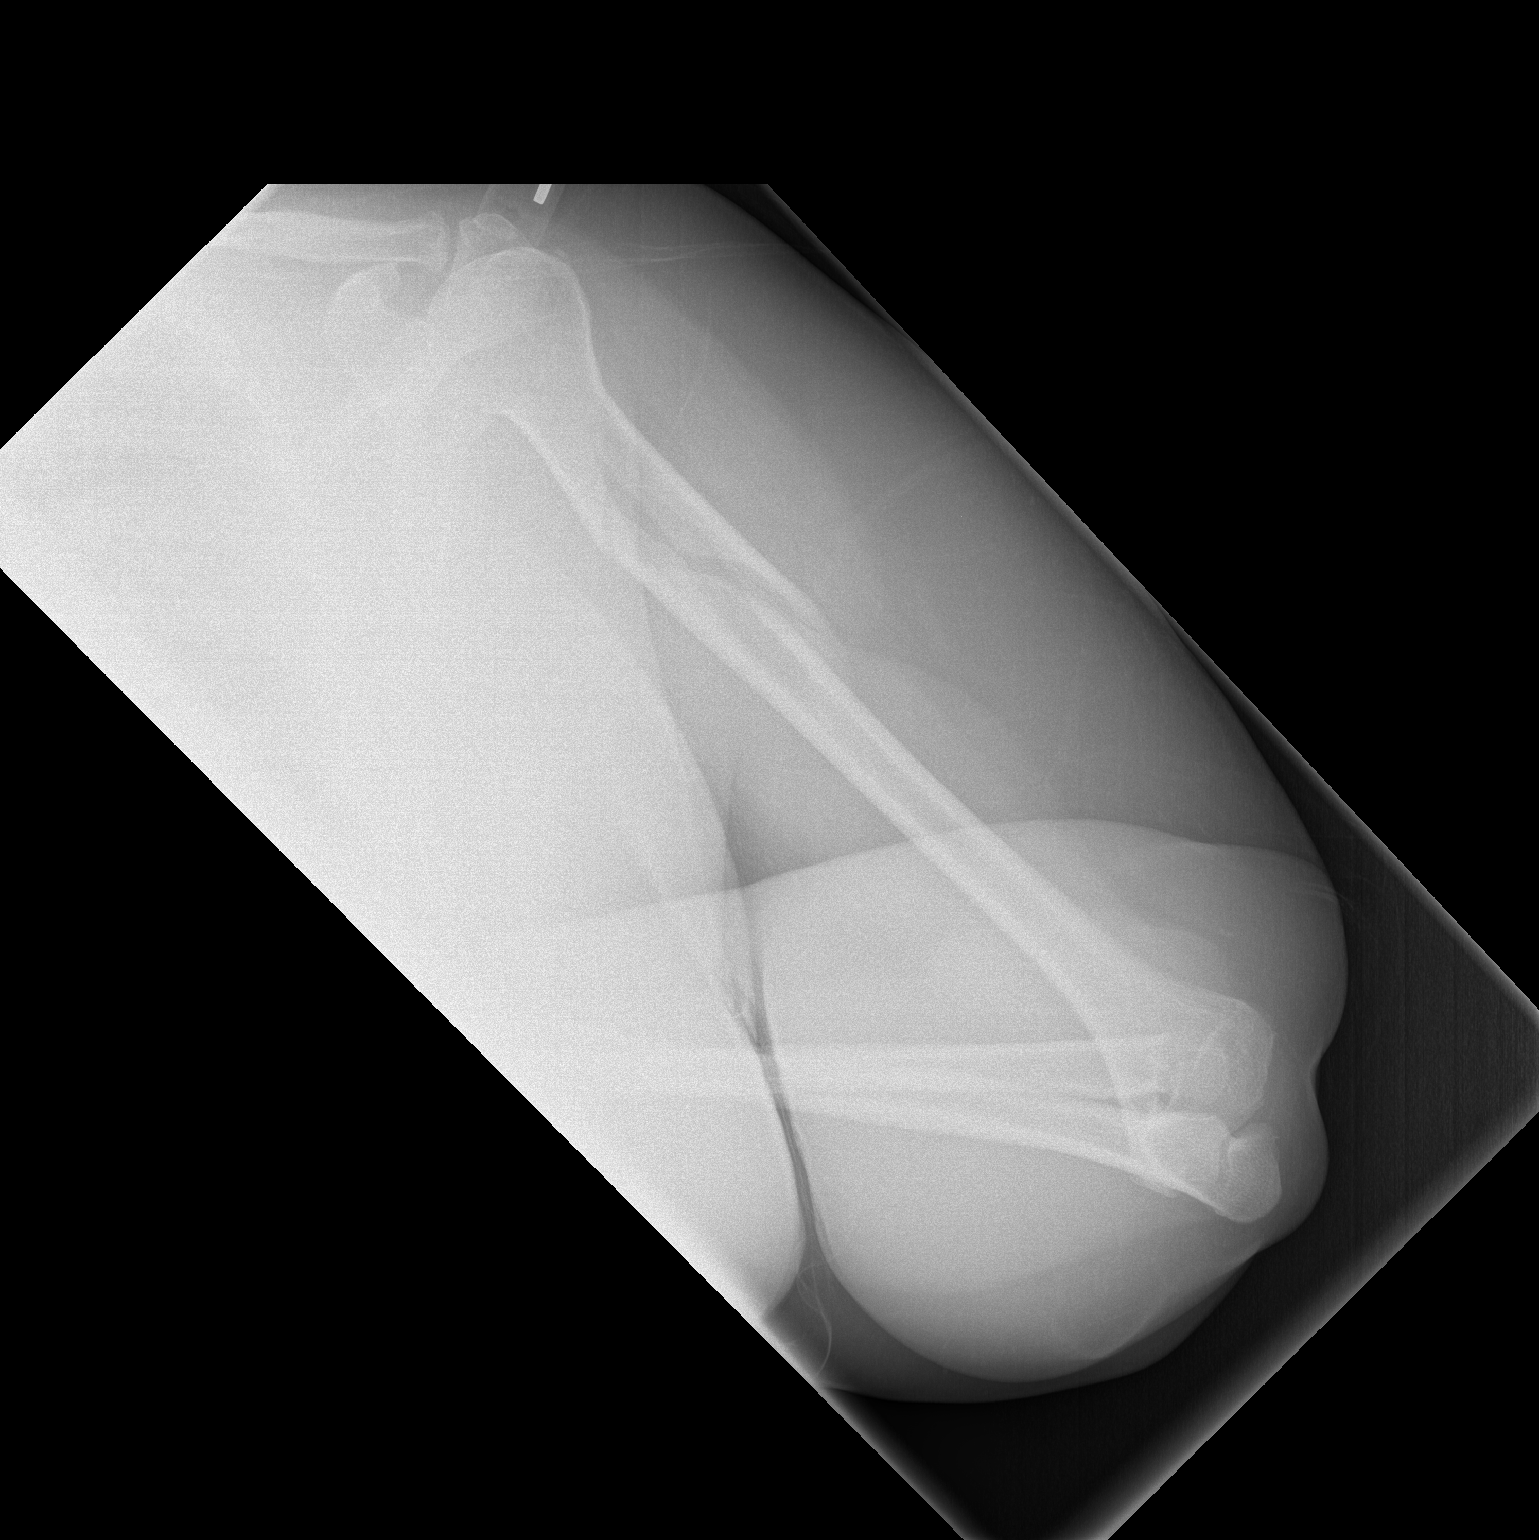

[w humerus ap left * (2 of 2)]
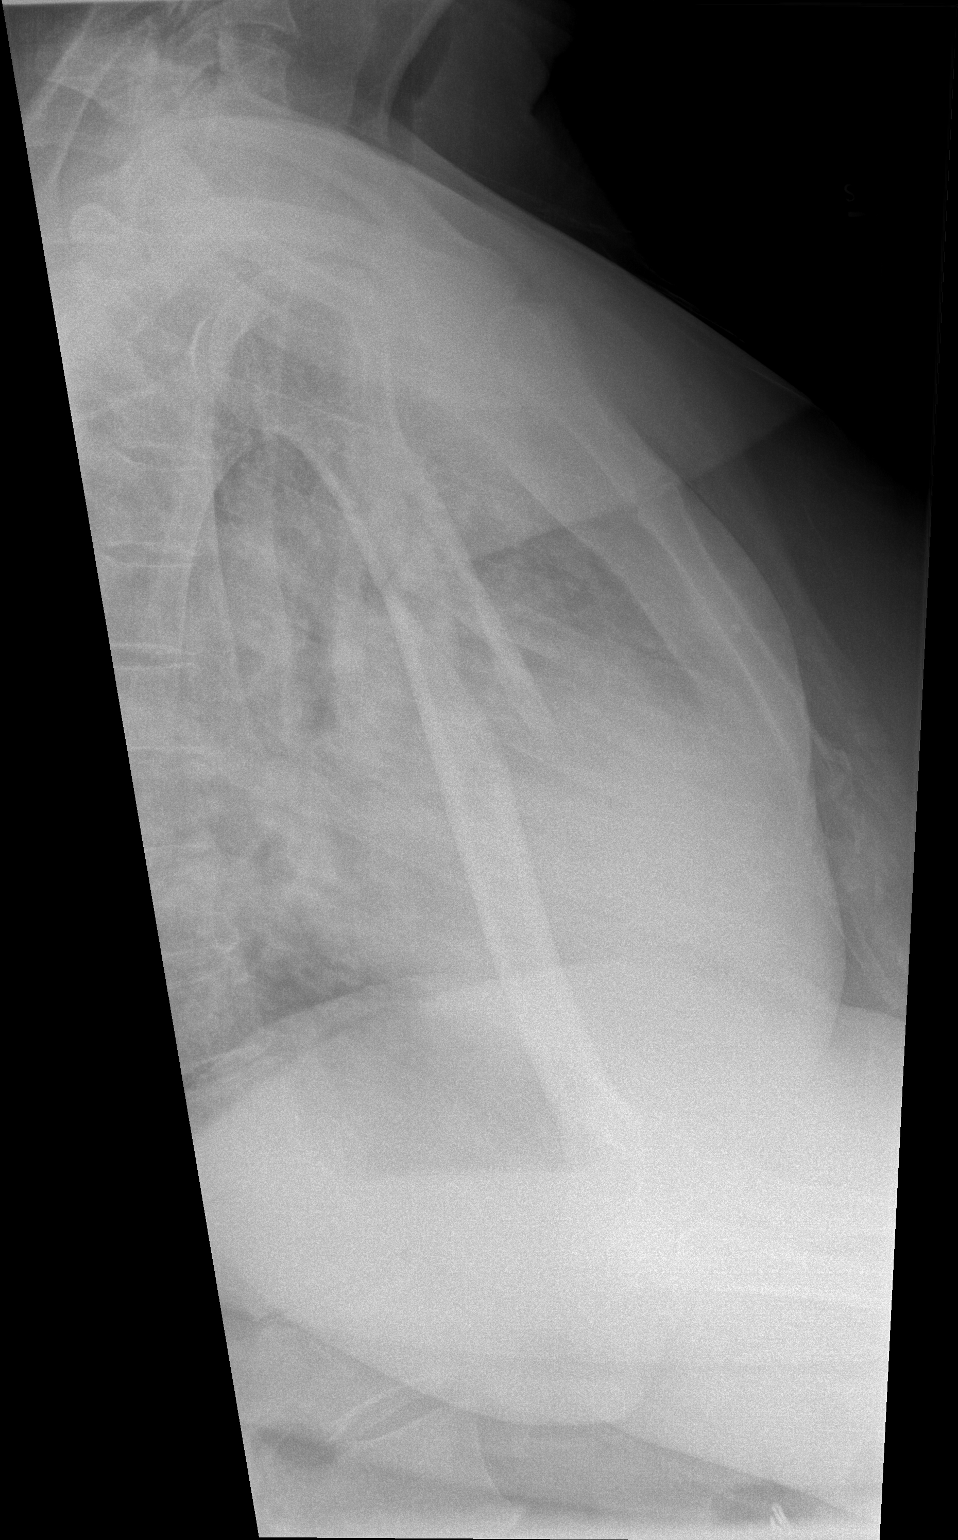

[2 of 2 positions shown; findings below may reference images not displayed]

FINDINGS: There is a comminuted fracture of the proximal left humeral shaft
with slight medial and posterior displacement. Diffuse soft tissue
swelling is present. No radiopaque foreign body. Osseous
mineralization is normal.
IMPRESSION: Comminuted mildly displaced fracture of the proximal left humeral
shaft.

## 2015-11-23 ENCOUNTER — Telehealth: Payer: Self-pay | Admitting: General Practice

## 2015-11-23 MED ORDER — QUINAPRIL HCL 40 MG PO TABS: 40.0000 mg | ORAL_TABLET | Freq: Every day | ORAL | Status: DC

## 2015-11-23 NOTE — Telephone Encounter (Signed)
Patient is establishing care on 12/28.  She is running out of her blood pressure medication.  She would like to know if quinapril 40 mg once at bed time could be sent to [facility] at [address].

## 2015-11-23 NOTE — Telephone Encounter (Signed)
Medication sent to pharmacy  

## 2015-11-24 NOTE — Telephone Encounter (Signed)
Left patient vm °

## 2015-12-15 ENCOUNTER — Ambulatory Visit (INDEPENDENT_AMBULATORY_CARE_PROVIDER_SITE_OTHER): Payer: 59 | Admitting: Family

## 2015-12-15 ENCOUNTER — Encounter: Payer: Self-pay | Admitting: Family

## 2015-12-15 ENCOUNTER — Other Ambulatory Visit (INDEPENDENT_AMBULATORY_CARE_PROVIDER_SITE_OTHER): Payer: 59

## 2015-12-15 ENCOUNTER — Telehealth: Payer: Self-pay | Admitting: Family

## 2015-12-15 VITALS — BP 132/84 | HR 77 | Temp 98.3°F | Resp 18 | Ht 61.0 in | Wt 268.0 lb

## 2015-12-15 DIAGNOSIS — I10 Essential (primary) hypertension: Secondary | ICD-10-CM

## 2015-12-15 DIAGNOSIS — E1110 Type 2 diabetes mellitus with ketoacidosis without coma: Secondary | ICD-10-CM

## 2015-12-15 DIAGNOSIS — R7303 Prediabetes: Secondary | ICD-10-CM

## 2015-12-15 DIAGNOSIS — F411 Generalized anxiety disorder: Secondary | ICD-10-CM | POA: Diagnosis not present

## 2015-12-15 LAB — HEMOGLOBIN A1C: Hgb A1c MFr Bld: 7.4 % — ABNORMAL HIGH (ref 4.6–6.5)

## 2015-12-15 LAB — BASIC METABOLIC PANEL WITH GFR
BUN: 12 mg/dL (ref 6–23)
CO2: 30 meq/L (ref 19–32)
Calcium: 9.2 mg/dL (ref 8.4–10.5)
Chloride: 102 meq/L (ref 96–112)
Creatinine, Ser: 0.62 mg/dL (ref 0.40–1.20)
GFR: 107.66 mL/min
Glucose, Bld: 162 mg/dL — ABNORMAL HIGH (ref 70–99)
Potassium: 4.2 meq/L (ref 3.5–5.1)
Sodium: 140 meq/L (ref 135–145)

## 2015-12-15 MED ORDER — CITALOPRAM HYDROBROMIDE 40 MG PO TABS: 40.0000 mg | ORAL_TABLET | Freq: Every day | ORAL | Status: DC

## 2015-12-15 MED ORDER — QUINAPRIL HCL 40 MG PO TABS: 40.0000 mg | ORAL_TABLET | Freq: Every day | ORAL | Status: DC

## 2015-12-15 MED ORDER — METFORMIN HCL 500 MG PO TABS: 500.0000 mg | ORAL_TABLET | Freq: Two times a day (BID) | ORAL | Status: DC

## 2015-12-15 NOTE — Assessment & Plan Note (Signed)
Morbid obesity with BMI of 50.66. Discussed importance of eating a nutritional intake that is moderate and varied and emphasizes nutrient dense foods and is low in saturated and transfer fats. Also encouraged exercise with goal of 30 minutes daily. Recommended weight loss of 5-10% of current body weight in the next 6 months.

## 2015-12-15 NOTE — Progress Notes (Signed)
Subjective:    Patient ID: Morgan Rivera, female    DOB: 07-Apr-1966, 49 y.o.   MRN: [id number]  Chief Complaint  Patient presents with  . Establish Care    wants to be checked for diabetes, would like refills of quinipril and citalopram    HPI:  Morgan Rivera is a 49 y.o. female who  has a past medical history of Hypertension; Anxiety; GERD (gastroesophageal reflux disease); Prediabetes; Sleep apnea; and Migraines. and presents today for an office visit to establish care.  1.) Concern for diabetes - Previously diagnosed with prediabetes and has a significant family history of Type 2 diabetes. Recently seen for her annual well woman exam and was noted to have an elevated A1c.  Has had some tingling in her fingers and has noted that she did have a recent eye sight change.    2.) Hypertension - Currently maintained on quinapril and reports her blood pressure as stable. Takes the medication as prescribed and denies adverse side effects or hypotensive readings. Denies symptoms of end organ damage. Home blood pressure readings are stable  BP Readings from Last 3 Encounters:  12/15/15 132/84  05/06/14 138/82  11/21/13 132/58     3.) Anxiety - Currently maintained on citalopram and reports her anxiety is stable and well controlled. Takes the medication as prescribed and denies adverse side effects.    Allergies  Allergen Reactions  . Food     Rosemary, scratchy sore throat    . Hydrocodone     Vertigo, nausea, vomiting     Outpatient Prescriptions Prior to Visit  Medication Sig Dispense Refill  . meclizine (ANTIVERT) 50 MG tablet Take 1 tablet (50 mg total) by mouth 3 (three) times daily as needed. 30 tablet 0  . citalopram (CELEXA) 40 MG tablet Take 40 mg by mouth daily.    . ondansetron (ZOFRAN ODT) 4 MG disintegrating tablet Take 1 tablet (4 mg total) by mouth every 8 (eight) hours as needed for nausea. 20 tablet 0  . quinapril (ACCUPRIL) 40 MG tablet Take 1 tablet (40 mg  total) by mouth at bedtime. 30 tablet 0   No facility-administered medications prior to visit.     Past Medical History  Diagnosis Date  . Hypertension   . Anxiety   . GERD (gastroesophageal reflux disease)   . Prediabetes   . Sleep apnea   . Migraines      Past Surgical History  Procedure Laterality Date  . Cholecystectomy    . Carpal tunnel release    . Foot bone excision    . Breath tek h pylori N/A 05/25/2014    Procedure: BREATH TEK H PYLORI;  Surgeon: Kandis Cockingavid H Newman, MD;  Location: [facility];  Service: General;  Laterality: N/A;  . Tubal ligation       Family History  Problem Relation Age of Onset  . Diabetes Mother 1660  . Cancer Mother     hx of skin cancer  . Heart disease Mother   . Hypertension Mother   . Diabetes Father 4970  . Cancer Father 5850    prostate  . Heart disease Maternal Grandfather   . Diabetes Maternal Grandfather   . Diabetes Paternal Grandmother      Social History   Social History  . Marital Status: Married    Spouse Name: N/A  . Number of Children: 1  . Years of Education: 14   Occupational History  . Unemployed    Social History Main  Topics  . Smoking status: Never Smoker   . Smokeless tobacco: Never Used  . Alcohol Use: No  . Drug Use: No  . Sexual Activity: Yes    Birth Control/ Protection: Surgical   Other Topics Concern  . Not on file   Social History Narrative   Fun: Crafts and music   Denies abuse and feels safe at home.      Review of Systems  Eyes:       Positive for vision change.   Respiratory: Negative for chest tightness.   Cardiovascular: Negative for chest pain, palpitations and leg swelling.  Endocrine: Negative for polydipsia, polyphagia and polyuria.  Neurological: Negative for numbness.      Objective:    BP 132/84 mmHg  Pulse 77  Temp(Src) 98.3 F (36.8 C) (Oral)  Resp 18  Ht  (1.549 m)  Wt 268 lb (121.564 kg)  BMI 50.66 kg/m2  SpO2 94% Nursing note and vital signs  reviewed.  Physical Exam  Constitutional: She is oriented to person, place, and time. She appears well-developed and well-nourished. No distress.  Cardiovascular: Normal rate, regular rhythm, normal heart sounds and intact distal pulses.   Pulmonary/Chest: Effort normal and breath sounds normal.  Neurological: She is alert and oriented to person, place, and time.  Skin: Skin is warm and dry.  Psychiatric: She has a normal mood and affect. Her behavior is normal. Judgment and thought content normal.       Assessment & Plan:   Problem List Items Addressed This Visit      Cardiovascular and Mediastinum   Essential hypertension - Primary    Hypertension is stable below goal 140/90 with current regimen. Denies adverse side effects or cough. Continue current dosage of quinapril. Continue to monitor blood pressure at home. Follow-up in 3 months.      Relevant Medications   quinapril (ACCUPRIL) 40 MG tablet   Other Relevant Orders   Basic Metabolic Panel (BMET)     Other   Morbid obesity (HCC)    Morbid obesity with BMI of 50.66. Discussed importance of eating a nutritional intake that is moderate and varied and emphasizes nutrient dense foods and is low in saturated and transfer fats. Also encouraged exercise with goal of 30 minutes daily. Recommended weight loss of 5-10% of current body weight in the next 6 months.      GAD (generalized anxiety disorder)    Anxiety is well controlled with current dosage of Celexa. Denies adverse side effects. Concern for potential weight increase as a result of Celexa which may possibly be contributing to her obesity. Continue current dosage of Celexa at this time. Follow-up for symptoms of worsening anxiety.      Relevant Medications   citalopram (CELEXA) 40 MG tablet   Prediabetes    Previously diagnosed as prediabetes and on recent well woman exam noted to have an elevated blood sugar level. Obtain A1c and basic metabolic panel. Discussed  importance of weight management and potential need for medication pending A1c results. Follow-up pending A1c results.      Relevant Medications   quinapril (ACCUPRIL) 40 MG tablet   Other Relevant Orders   Hemoglobin A1c   Basic Metabolic Panel (BMET)

## 2015-12-15 NOTE — Assessment & Plan Note (Signed)
Previously diagnosed as prediabetes and on recent well woman exam noted to have an elevated blood sugar level. Obtain A1c and basic metabolic panel. Discussed importance of weight management and potential need for medication pending A1c results. Follow-up pending A1c results.

## 2015-12-15 NOTE — Patient Instructions (Addendum)
Thank you for choosing [facility].  Summary/Instructions:  Your prescription(s) have been submitted to your pharmacy or been printed and provided for you. Please take as directed and contact our office if you believe you are having problem(s) with the medication(s) or have any questions.  If your symptoms worsen or fail to improve, please contact our office for further instruction, or in case of emergency go directly to the emergency room at the closest medical facility.   Please continue to take your medications as prescribed.  

## 2015-12-15 NOTE — Assessment & Plan Note (Signed)
Anxiety is well controlled with current dosage of Celexa. Denies adverse side effects. Concern for potential weight increase as a result of Celexa which may possibly be contributing to her obesity. Continue current dosage of Celexa at this time. Follow-up for symptoms of worsening anxiety.

## 2015-12-15 NOTE — Telephone Encounter (Signed)
Please inform patient that her A1c is 7.4 which is consistent with type 2 diabetes. Therefore as we discussed, I have sent in a prescription for her to start: Metformin. We will plan to follow-up in 2 weeks to initiate her diabetic prevention. Please call with any questions or concerns.

## 2015-12-15 NOTE — Assessment & Plan Note (Signed)
Hypertension is stable below goal 140/90 with current regimen. Denies adverse side effects or cough. Continue current dosage of quinapril. Continue to monitor blood pressure at home. Follow-up in 3 months.

## 2015-12-15 NOTE — Progress Notes (Signed)
Pre visit review using our clinic review tool, if applicable. No additional management support is needed unless otherwise documented below in the visit note. 

## 2015-12-17 NOTE — Telephone Encounter (Signed)
LVM for pt to call back.

## 2015-12-21 NOTE — Telephone Encounter (Signed)
Pt aware of results 

## 2015-12-24 ENCOUNTER — Ambulatory Visit: Payer: Self-pay | Admitting: Family

## 2016-01-20 ENCOUNTER — Encounter: Payer: Self-pay | Admitting: Family

## 2016-01-20 ENCOUNTER — Ambulatory Visit (INDEPENDENT_AMBULATORY_CARE_PROVIDER_SITE_OTHER): Payer: PRIVATE HEALTH INSURANCE | Admitting: Family

## 2016-01-20 VITALS — BP 138/82 | HR 18 | Temp 97.8°F | Resp 16 | Ht 61.0 in | Wt 266.1 lb

## 2016-01-20 DIAGNOSIS — F411 Generalized anxiety disorder: Secondary | ICD-10-CM

## 2016-01-20 DIAGNOSIS — E119 Type 2 diabetes mellitus without complications: Secondary | ICD-10-CM

## 2016-01-20 MED ORDER — CITALOPRAM HYDROBROMIDE 40 MG PO TABS: 40.0000 mg | ORAL_TABLET | Freq: Every day | ORAL | Status: DC

## 2016-01-20 MED ORDER — GLYBURIDE 2.5 MG PO TABS: 2.5000 mg | ORAL_TABLET | Freq: Every day | ORAL | Status: DC

## 2016-01-20 MED ORDER — BENAZEPRIL HCL 40 MG PO TABS: 40.0000 mg | ORAL_TABLET | Freq: Every day | ORAL | Status: DC

## 2016-01-20 NOTE — Patient Instructions (Signed)
Thank you for choosing [facility].  Summary/Instructions:  Your prescription(s) have been submitted to your pharmacy or been printed and provided for you. Please take as directed and contact our office if you believe you are having problem(s) with the medication(s) or have any questions.    

## 2016-01-20 NOTE — Progress Notes (Signed)
Subjective:    Patient ID: Morgan Rivera, female    DOB: [birth year], 50 y.o.   MRN: [id number]  Chief Complaint  Patient presents with  . Follow-up    diabetes follow up, was put on metformin states that she has had a nagging headache everyday since being on it    HPI:  Morgan Rivera is a 50 y.o. female who  has a past medical history of Hypertension; Anxiety; GERD (gastroesophageal reflux disease); Prediabetes; Sleep apnea; and Migraines. and presents today for an office follow up.  1.) Diabetes - currently maintained on metformin. Takes medication as prescribed and notes a nagging headache since starting the medication. Denies any symptoms of end organ damage and does not currently check her blood sugar at home.  Lab Results  Component Value Date   HGBA1C 7.4* 12/15/2015    Allergies  Allergen Reactions  . Food     Rosemary, scratchy sore throat    . Hydrocodone     Vertigo, nausea, vomiting     Current Outpatient Prescriptions on File Prior to Visit  Medication Sig Dispense Refill  . meclizine (ANTIVERT) 50 MG tablet Take 1 tablet (50 mg total) by mouth 3 (three) times daily as needed. 30 tablet 0   No current facility-administered medications on file prior to visit.     Review of Systems  Eyes:       Negative for changes in vision.  Respiratory: Negative for chest tightness and shortness of breath.   Cardiovascular: Negative for chest pain, palpitations and leg swelling.  Neurological: Positive for headaches. Negative for weakness and numbness.      Objective:    BP 138/82 mmHg  Pulse 18  Temp(Src) 97.8 F (36.6 C) (Oral)  Resp 16  Ht  (1.549 m)  Wt 266 lb 1.9 oz (120.711 kg)  BMI 50.31 kg/m2  SpO2 97% Nursing note and vital signs reviewed.  Physical Exam  Constitutional: She is oriented to person, place, and time. She appears well-developed and well-nourished. No distress.  Cardiovascular: Normal rate, regular rhythm, normal heart sounds  and intact distal pulses.   Pulmonary/Chest: Effort normal and breath sounds normal.  Neurological: She is alert and oriented to person, place, and time.  Diabetic Foot Exam - Simple   Simple Foot Form  Diabetic Foot exam was performed with the following findings:  Yes  01/20/2016 10:57 AM  Visual Inspection  No deformities, no ulcerations, no other skin breakdown bilaterally:  Yes  Sensation Testing  Intact to touch and monofilament testing bilaterally:  Yes  Pulse Check  Posterior Tibialis and Dorsalis pulse intact bilaterally:  Yes  Comments     Skin: Skin is warm and dry.  Psychiatric: She has a normal mood and affect. Her behavior is normal. Judgment and thought content normal.       Assessment & Plan:   Problem List Items Addressed This Visit      Endocrine   Type 2 diabetes mellitus (HCC) - Primary    Recently diagnosed with type 2 diabetes and started on metformin. Takes medication as prescribed and notes a headache after taking the medication. Discontinue metformin. Start glyburide. Diabetic foot exam completed today diabetic eye exam is up-to-date. She is maintained on benazepril for CAD risk reduction. Discussed Pneumovax and will administer vaccination at next A1c check secondary to recent vaccinations received approximately one week ago. Emphasize continued management through nutrition with emphasis on nutrient dense foods and low in saturated fats and processed  foods. Increase physical activity of 30 minutes daily of moderate level activity. Follow-up in 6 weeks for A1c.      Relevant Medications   benazepril (LOTENSIN) 40 MG tablet   glyBURIDE (DIABETA) 2.5 MG tablet     Other   GAD (generalized anxiety disorder)   Relevant Medications   citalopram (CELEXA) 40 MG tablet

## 2016-01-20 NOTE — Assessment & Plan Note (Signed)
Recently diagnosed with type 2 diabetes and started on metformin. Takes medication as prescribed and notes a headache after taking the medication. Discontinue metformin. Start glyburide. Diabetic foot exam completed today diabetic eye exam is up-to-date. She is maintained on benazepril for CAD risk reduction. Discussed Pneumovax and will administer vaccination at next A1c check secondary to recent vaccinations received approximately one week ago. Emphasize continued management through nutrition with emphasis on nutrient dense foods and low in saturated fats and processed foods. Increase physical activity of 30 minutes daily of moderate level activity. Follow-up in 6 weeks for A1c.

## 2016-01-20 NOTE — Progress Notes (Signed)
Pre visit review using our clinic review tool, if applicable. No additional management support is needed unless otherwise documented below in the visit note. 

## 2016-01-21 ENCOUNTER — Telehealth: Payer: Self-pay | Admitting: Family

## 2016-01-21 MED ORDER — METFORMIN HCL 500 MG PO TABS: 500.0000 mg | ORAL_TABLET | Freq: Two times a day (BID) | ORAL | Status: DC

## 2016-01-21 NOTE — Telephone Encounter (Signed)
Please advise 

## 2016-01-21 NOTE — Telephone Encounter (Signed)
Stop glyburide and new prescription for metformin placed.

## 2016-01-21 NOTE — Telephone Encounter (Signed)
[facility] - Client TELEPHONE ADVICE RECORD [facility]  Patient Name: Morgan Rivera  DOB: 1966/09/24    Initial Comment Caller states she was seen yesterday, advised metformin giving headache, glyboride rx her. Now gave her sweats and shakes.    Nurse Assessment  Nurse: Dorthula Rue, RN, Enrique Sack Date/Time (Eastern Time): 01/21/2016 3:47:49 PM  Confirm and document reason for call. If symptomatic, describe symptoms. You must click the next button to save text entered. ---Caller states she was taking Metformin, but it was giving her slight headaches. She states she was switched to [facility] and it was causing sweats and making her feel shaky. She states she took it like 30-40 minutes after she ate. She took it will a full breakfast and did okay. She ate at 11am and got the sweats and shakes again.  Has the patient traveled out of the country within the last 30 days? ---Not Applicable  Does the patient have any new or worsening symptoms? ---Yes  Will a triage be completed? ---Yes  Related visit to physician within the last 2 weeks? ---N/A  Does the PT have any chronic conditions? (i.e. diabetes, asthma, etc.) ---Yes  List chronic conditions. ---Type 2 Diabetes  Did the patient indicate they were pregnant? ---No  Is this a behavioral health or substance abuse call? ---No     Guidelines    Guideline Title Affirmed Question Affirmed Notes  Sweating [1] MODERATE sweating (e.g., interferes with normal activities like work or school) AND [2] possibly related to new medication or change in medication dosage    Final Disposition User   See PCP When Office is Open (within 3 days) Dorthula Rue, RN, [employer] does not want to be seen. She was just seen yesterday. She wants Metformin called in and wants to stop Glyboride due to shakes and sweats. Please call her back   Referrals  GO TO FACILITY REFUSED   Disagree/Comply: Disagree  Disagree/Comply Reason:  Disagree with instructions

## 2016-01-21 NOTE — Telephone Encounter (Signed)
Pt aware.

## 2016-03-02 ENCOUNTER — Ambulatory Visit: Payer: Self-pay | Admitting: Family

## 2016-04-20 ENCOUNTER — Other Ambulatory Visit: Payer: Self-pay | Admitting: Family

## 2016-05-24 ENCOUNTER — Other Ambulatory Visit: Payer: Self-pay | Admitting: *Deleted

## 2016-05-24 MED ORDER — METFORMIN HCL 500 MG PO TABS: 500.0000 mg | ORAL_TABLET | Freq: Two times a day (BID) | ORAL | Status: AC

## 2016-08-18 ENCOUNTER — Other Ambulatory Visit: Payer: Self-pay | Admitting: Family

## 2016-11-02 ENCOUNTER — Other Ambulatory Visit: Payer: Self-pay

## 2016-11-02 MED ORDER — CITALOPRAM HYDROBROMIDE 40 MG PO TABS: 40.0000 mg | ORAL_TABLET | Freq: Every day | ORAL | 0 refills | Status: AC

## 2017-01-18 ENCOUNTER — Ambulatory Visit (INDEPENDENT_AMBULATORY_CARE_PROVIDER_SITE_OTHER): Payer: BLUE CROSS/BLUE SHIELD | Admitting: Orthopaedic Surgery

## 2017-01-18 ENCOUNTER — Encounter (INDEPENDENT_AMBULATORY_CARE_PROVIDER_SITE_OTHER): Payer: Self-pay | Admitting: Orthopaedic Surgery

## 2017-01-18 VITALS — BP 118/72 | HR 85 | Ht 61.0 in | Wt 252.0 lb

## 2017-01-18 DIAGNOSIS — M545 Low back pain: Secondary | ICD-10-CM | POA: Diagnosis not present

## 2017-01-18 DIAGNOSIS — G8929 Other chronic pain: Secondary | ICD-10-CM | POA: Diagnosis not present

## 2017-01-18 NOTE — Progress Notes (Signed)
Office Visit Note   Patient: Morgan Rivera           Date of Birth: April 12, 1966           MRN: [id number] Visit Date: 01/18/2017              Requested by: Veryl SpeakGregory D Calone, FNP [address] [place], [place] [id number] PCP: Jeanine Luzalone, Gregory, FNP   Assessment & Plan: Visit Diagnoses:  1. Chronic right-sided low back pain without sciatica     Plan: We will set her up for some physical therapy. Office follow-up 5 weeks for recheck.  Follow-Up Instructions: Return in about 5 weeks (around 02/22/2017).   Orders:  No orders of the defined types were placed in this encounter.  No orders of the defined types were placed in this encounter.     Procedures: No procedures performed   Clinical Data: No additional findings.   Subjective: Chief Complaint  Patient presents with  . Lower Back - Pain    Patient presents with low back pain. She states that the pain radiates down into both legs, R>L.  The pain extends to just above the knee. She has just a little numbness and tingling in her toes. Patient has had plain films made and has CD with her. She just finished a prescription for prednisone and states that has made it some better. She is not taking anything for pain.   Patient worked at [employer]  up until 7 months ago. She's had some back pain for years. Recently it's been worse. She's had more pain in her right and left leg. She states she is applying for disability and has an Pensions consultantattorney. She relates that she set a previous arm fracture from an on-the-job injury, had problems with a frozen shoulder that was treated.  Review of Systems 14 point review of systems performed and she has some diabetes morbid obesity, hypertension, past history of the arm fracture, frozen shoulder, chronic back pain. No current bowel or bladder symptoms related to her back.   Objective: Vital Signs: BP 118/72   Pulse 85   Ht 5\' 1"  (1.549 m)   Wt 252 lb (114.3 kg)   BMI 47.61 kg/m   Physical Exam    Constitutional: She is oriented to person, place, and time. She appears well-developed.  HENT:  Head: Normocephalic.  Right Ear: External ear normal.  Left Ear: External ear normal.  Eyes: Pupils are equal, round, and reactive to light.  Neck: No tracheal deviation present. No thyromegaly present.  Cardiovascular: Normal rate.   Pulmonary/Chest: Effort normal.  Abdominal: Soft.  Truncal obesity but liver and spleen are nonpalpable due to body habitus.  Musculoskeletal:  No scoliosis patient has a heel toe gait. Negative straight leg raising 90 right left normal hip range of motion reflexes are 1+ and symmetrical. Anterior tib EHL is strong. No rash over exposed skin no atrophy. She has some tenderness lumbosacral junction. Pelvis is level. No pain with internal/external rotation of her hips knees reach full extension negative positive compression test. She has some crepitus with knee extension.  Neurological: She is alert and oriented to person, place, and time.  Skin: Skin is warm and dry.  Psychiatric: She has a normal mood and affect. Her behavior is normal.    Ortho Exam  Specialty Comments:  No specialty comments available.  Imaging: No results found.   PMFS History: Patient Active Problem List   Diagnosis Date Noted  . Type 2 diabetes  mellitus (HCC) 01/20/2016  . Essential hypertension 12/15/2015  . GAD (generalized anxiety disorder) 12/15/2015  . Prediabetes 12/15/2015  . Morbid obesity (HCC) 05/06/2014   Past Medical History:  Diagnosis Date  . Anxiety   . Depression   . Diabetes mellitus without complication (HCC)   . GERD (gastroesophageal reflux disease)   . Hypertension   . Migraines   . Prediabetes   . Sleep apnea     Family History  Problem Relation Age of Onset  . Diabetes Mother 42  . Cancer Mother     hx of skin cancer  . Heart disease Mother   . Hypertension Mother   . Diabetes Father 53  . Cancer Father 12    prostate  . Heart disease  Maternal Grandfather   . Diabetes Maternal Grandfather   . Diabetes Paternal Grandmother     Past Surgical History:  Procedure Laterality Date  . BREATH TEK H PYLORI N/A 05/25/2014   Procedure: BREATH TEK H PYLORI;  Surgeon: Kandis Cocking, MD;  Location: [facility];  Service: General;  Laterality: N/A;  . CARPAL TUNNEL RELEASE    . CHOLECYSTECTOMY    . FOOT BONE EXCISION    . SHOULDER SURGERY  2016   frozen shoulder  . TUBAL LIGATION     Social History   Occupational History  . Unemployed    Social History Main Topics  . Smoking status: Never Smoker  . Smokeless tobacco: Never Used  . Alcohol use No  . Drug use: No  . Sexual activity: Yes    Birth control/ protection: Surgical

## 2017-02-15 ENCOUNTER — Ambulatory Visit (INDEPENDENT_AMBULATORY_CARE_PROVIDER_SITE_OTHER): Payer: BLUE CROSS/BLUE SHIELD | Admitting: Orthopaedic Surgery

## 2017-09-27 ENCOUNTER — Encounter (INDEPENDENT_AMBULATORY_CARE_PROVIDER_SITE_OTHER): Payer: Self-pay | Admitting: Orthopaedic Surgery

## 2017-09-27 ENCOUNTER — Ambulatory Visit (INDEPENDENT_AMBULATORY_CARE_PROVIDER_SITE_OTHER): Payer: BLUE CROSS/BLUE SHIELD | Admitting: Orthopaedic Surgery

## 2017-09-27 VITALS — BP 139/67 | HR 80 | Ht 61.0 in | Wt 265.0 lb

## 2017-09-27 DIAGNOSIS — M2242 Chondromalacia patellae, left knee: Secondary | ICD-10-CM | POA: Diagnosis not present

## 2017-09-27 MED ORDER — BUPIVACAINE HCL 0.25 % IJ SOLN
0.6600 mL | INTRAMUSCULAR | Status: AC | PRN
  Administered 2017-09-27: .66 mL via INTRA_ARTICULAR

## 2017-09-27 MED ORDER — LIDOCAINE HCL 1 % IJ SOLN
1.0000 mL | INTRAMUSCULAR | Status: AC | PRN
  Administered 2017-09-27: 1 mL

## 2017-09-27 MED ORDER — METHYLPREDNISOLONE ACETATE 40 MG/ML IJ SUSP
40.0000 mg | INTRAMUSCULAR | Status: AC | PRN
  Administered 2017-09-27: 40 mg via INTRA_ARTICULAR

## 2017-09-27 NOTE — Progress Notes (Signed)
Office Visit Note   Patient: Morgan Rivera           Date of Birth: 05/21/1966           MRN: [id number] Visit Date: 09/27/2017              Requested by: Veryl Speak, FNP [address] [place], [place] [address] PCP: Veryl Speak, FNP   Assessment & Plan: Visit Diagnoses:  1. Chondromalacia patellae, left knee     Plan: Intra-articular injection performed. She gets some relief immediately after the injection with ambulation. Will set up for some physical therapy for strengthening left knee treatment unless patella and I'll recheck her again in 5 weeks. If she has persistent symptoms we'll consider MRI imaging. She's continuing to work on weight loss to help unload her knee as well as her back. She'll check her sugars carefully and make sure she doesn't have hyperglycemia after the knee injection.  Follow-Up Instructions: Return in about 5 weeks (around 11/01/2017).   Orders:  Orders Placed This Encounter  Procedures  . Large Joint Injection/Arthrocentesis  . Ambulatory referral to Physical Therapy   No orders of the defined types were placed in this encounter.     Procedures: Large Joint Inj Date/Time: 09/27/2017 10:54 AM Performed by: Eldred Manges Authorized by: Eldred Manges   Consent Given by:  Patient Site marked: the procedure site was marked   Indications:  Pain and joint swelling Location:  Knee Site:  L knee Needle Size:  22 G Needle Length:  1.5 inches Approach:  Anterolateral Ultrasound Guidance: No   Fluoroscopic Guidance: No   Arthrogram: No   Medications:  1 mL lidocaine 1 %; 40 mg methylPREDNISolone acetate 40 MG/ML; 0.66 mL bupivacaine 0.25 % Aspiration Attempted: No   Patient tolerance:  Patient tolerated the procedure well with no immediate complications     Clinical Data: No additional findings.   Subjective: Chief Complaint  Patient presents with  . Left Knee - Pain    HPI 51 year old female with left knee pain since  February. She fell on the steps in February she states her knee sometimes gives way and swells. She sees Beryle Beams on the past also meloxicam, currently diclofenac use ice and heat she has problems going up steps and has to go right foot first breech step. She has problems if she gets on the floor she can I get back up. Pain in her knees getting from sitting to standing. No fever or chills. She does take metformin and states her last A1c was 7. Previous x-rays 03/16/2017 showed mild degenerative changes involving the medial compartment and she states her pain is primarily medial and then behind the kneecap. Patient requested injection left knee.  Review of Systems review of systems by stress reflux anxiety, knee arthritis, depression, diabetes control with metformin, hypertension, migraines, sleep apnea, obesity. She does not smoke or drink. Previous surgeries include endometriosis surgery and also tubal ligation 1998. Cholecystectomy 2009.   Objective: Vital Signs: BP 139/67   Pulse 80   Ht  (1.549 m)   Wt 265 lb (120.2 kg)   BMI 50.07 kg/m   Physical Exam  Constitutional: She is oriented to person, place, and time. She appears well-developed.  HENT:  Head: Normocephalic.  Right Ear: External ear normal.  Left Ear: External ear normal.  Eyes: Pupils are equal, round, and reactive to light.  Neck: No tracheal deviation present. No thyromegaly present.  Cardiovascular: Normal  rate.   Pulmonary/Chest: Effort normal.  Abdominal: Soft.  Neurological: She is alert and oriented to person, place, and time.  Skin: Skin is warm and dry.  Psychiatric: She has a normal mood and affect. Her behavior is normal.    Ortho Exam negative straight leg raising. Patient has normal heel-to-toe gait. She is able to step up on the step the left foot first. She has crepitus with knee extension and with hip range of motion negative popliteal compression test anterior tib EHL is intact no pitting edema. Pedal  pulses are normal. Sensation the foot is normal. Negative sciatic notch tenderness. Some tenderness of the lumbar spine. No crepitus right knee with extension. Normal patellar tracking collateral cruciate ligament exam is normal.  Specialty Comments:  No specialty comments available.  Imaging: No results found.   PMFS History: Patient Active Problem List   Diagnosis Date Noted  . Chondromalacia patellae, left knee 09/27/2017  . Type 2 diabetes mellitus (HCC) 01/20/2016  . Essential hypertension 12/15/2015  . GAD (generalized anxiety disorder) 12/15/2015  . Prediabetes 12/15/2015  . Morbid obesity (HCC) 05/06/2014   Past Medical History:  Diagnosis Date  . Anxiety   . Depression   . Diabetes mellitus without complication (HCC)   . GERD (gastroesophageal reflux disease)   . Hypertension   . Migraines   . Prediabetes   . Sleep apnea     Family History  Problem Relation Age of Onset  . Diabetes Mother 14  . Cancer Mother        hx of skin cancer  . Heart disease Mother   . Hypertension Mother   . Diabetes Father 83  . Cancer Father 34       prostate  . Heart disease Maternal Grandfather   . Diabetes Maternal Grandfather   . Diabetes Paternal Grandmother     Past Surgical History:  Procedure Laterality Date  . BREATH TEK H PYLORI N/A 05/25/2014   Procedure: BREATH TEK H PYLORI;  Surgeon: Kandis Cocking, MD;  Location: [facility];  Service: General;  Laterality: N/A;  . CARPAL TUNNEL RELEASE    . CHOLECYSTECTOMY    . FOOT BONE EXCISION    . SHOULDER SURGERY  2016   frozen shoulder  . TUBAL LIGATION     Social History   Occupational History  . Unemployed    Social History Main Topics  . Smoking status: Never Smoker  . Smokeless tobacco: Never Used  . Alcohol use No  . Drug use: No  . Sexual activity: Yes    Birth control/ protection: Surgical

## 2017-10-02 ENCOUNTER — Telehealth (INDEPENDENT_AMBULATORY_CARE_PROVIDER_SITE_OTHER): Payer: Self-pay | Admitting: Orthopaedic Surgery

## 2017-10-02 NOTE — Telephone Encounter (Signed)
I called Lupita Leash and advised.

## 2017-10-02 NOTE — Telephone Encounter (Signed)
No. Patient has hard copy script and walked over to make appt when in [place] office.

## 2017-10-02 NOTE — Telephone Encounter (Signed)
Lupita Leash from [facility] called stating that they received a referral for this patient but they no longer have an office in [place].  Do you want her to forward the referral to the PT that is there in their old building or do you want the patient to be seen in their office here in [place]

## 2017-11-01 ENCOUNTER — Ambulatory Visit (INDEPENDENT_AMBULATORY_CARE_PROVIDER_SITE_OTHER): Payer: BLUE CROSS/BLUE SHIELD | Admitting: Orthopaedic Surgery

## 2017-11-01 ENCOUNTER — Encounter (INDEPENDENT_AMBULATORY_CARE_PROVIDER_SITE_OTHER): Payer: Self-pay | Admitting: Orthopaedic Surgery

## 2017-11-01 VITALS — BP 143/68 | HR 88 | Ht 61.0 in | Wt 265.0 lb

## 2017-11-01 DIAGNOSIS — M2242 Chondromalacia patellae, left knee: Secondary | ICD-10-CM | POA: Diagnosis not present

## 2017-11-01 NOTE — Addendum Note (Signed)
Addended by: Rogers SeedsYEATTS, Wilfrido Luedke M on: 11/01/2017 11:48 AM   Modules accepted: Orders

## 2017-11-01 NOTE — Progress Notes (Signed)
Office Visit Note   Patient: Morgan Rivera           Date of Birth: 1966-09-08           MRN: [id number] Visit Date: 11/01/2017              Requested by: [name], [name], FNP [address] [place], [place] [address] PCP: [name], [name], FNP   Assessment & Plan: Visit Diagnoses:  1. Chondromalacia patellae, left knee   2. Possible left knee meniscal tear.  Plan: Since Dr. activity modification she has had anti-inflammatories 2 different types.  She is taking gabapentin without relief.  She got temporary relief from the injection and then has had recurrent medial joint line pain with catching.  We will proceed with an MRI scan left knee and see her back after the scan for review.  Follow-Up Instructions: No Follow-up on file.   Orders:  No orders of the defined types were placed in this encounter.  No orders of the defined types were placed in this encounter.     Procedures: No procedures performed   Clinical Data: No additional findings.   Subjective: Chief Complaint  Patient presents with  . Left Knee - Follow-up, Pain    HPI 51-year-old female returns she had previous left knee injection 09/27/2017 is been through physical therapy.  She took diclofenac without relief.  Neurologist put her on some gabapentin but this did not help her knee pain.  She has had an MRI in the interim which showed some disc bulge saw Dr. [name] set her up for a couple epidural injections to hopefully give her some relief.  Her knee continues to hurt at the medial joint line left knee was catching.  She has not fallen.  She has pain on a daily basis pain with daily walking and community ambulation.  She did well for the injection but it only lasted 1-2 weeks and then she had recurrence as before.  Review of Systems systems updated from 09/27/2017 and is unchanged.  Overall controlled diabetes, low back pain, increased BMI, previous gallbladder removal otherwise no change other  than as mentioned in HPI.   Objective: Vital Signs: BP (!) 143/68   Pulse 88   Ht 5\' 1"  (1.549 m)   Wt 265 lb (120.2 kg)   BMI 50.07 kg/m   Physical Exam  Constitutional: She is oriented to person, place, and time. She appears well-developed.  HENT:  Head: Normocephalic.  Right Ear: External ear normal.  Left Ear: External ear normal.  Eyes: Pupils are equal, round, and reactive to light.  Neck: No tracheal deviation present. No thyromegaly present.  Cardiovascular: Normal rate.  Pulmonary/Chest: Effort normal.  Abdominal: Soft.  Neurological: She is alert and oriented to person, place, and time.  Skin: Skin is warm and dry.  Psychiatric: She has a normal mood and affect. Her behavior is normal.    Ortho Exam ambulates with a left knee limp she has considerable joint line tenderness along the medial joint line left knee.  [name] test is negative medial collateral ligament takes normal stress.  She has tenderness to the medial joint line at the level of the MCL and anterior.  Pain with hyperextension.  Negative anterior and posterior drawer test.  Pulses are normal no rash over exposed skin there is a 2+ knee effusion.  Specialty Comments:  No specialty comments available.  Imaging: No results found.   PMFS History: Patient Active Problem  List   Diagnosis Date Noted  . Chondromalacia patellae, left knee 09/27/2017  . Type 2 diabetes mellitus (HCC) 01/20/2016  . Essential hypertension 12/15/2015  . GAD (generalized anxiety disorder) 12/15/2015  . Prediabetes 12/15/2015  . Morbid obesity (HCC) 05/06/2014   Past Medical History:  Diagnosis Date  . Anxiety   . Depression   . Diabetes mellitus without complication (HCC)   . GERD (gastroesophageal reflux disease)   . Hypertension   . Migraines   . Prediabetes   . Sleep apnea     Family History  Problem Relation Age of Onset  . Diabetes Mother 2560  . Cancer Mother        hx of skin cancer  . Heart disease Mother     . Hypertension Mother   . Diabetes Father 5570  . Cancer Father 6950       prostate  . Heart disease Maternal Grandfather   . Diabetes Maternal Grandfather   . Diabetes Paternal Grandmother     Past Surgical History:  Procedure Laterality Date  . BREATH TEK H PYLORI N/A 05/25/2014   Procedure: BREATH TEK H PYLORI;  Surgeon: Kandis Cockingavid H Newman, MD;  Location: [facility];  Service: General;  Laterality: N/A;  . CARPAL TUNNEL RELEASE    . CHOLECYSTECTOMY    . FOOT BONE EXCISION    . SHOULDER SURGERY  2016   frozen shoulder  . TUBAL LIGATION     Social History   Occupational History  . Occupation: Unemployed  Tobacco Use  . Smoking status: Never Smoker  . Smokeless tobacco: Never Used  Substance and Sexual Activity  . Alcohol use: No  . Drug use: No  . Sexual activity: Yes    Birth control/protection: Surgical

## 2017-11-07 ENCOUNTER — Encounter: Payer: Self-pay | Admitting: Orthopaedic Surgery

## 2017-11-15 ENCOUNTER — Ambulatory Visit (INDEPENDENT_AMBULATORY_CARE_PROVIDER_SITE_OTHER): Payer: BLUE CROSS/BLUE SHIELD | Admitting: Orthopaedic Surgery

## 2017-11-15 ENCOUNTER — Encounter (INDEPENDENT_AMBULATORY_CARE_PROVIDER_SITE_OTHER): Payer: Self-pay | Admitting: Orthopaedic Surgery

## 2017-11-15 VITALS — BP 145/80 | HR 85 | Ht 61.0 in | Wt 265.0 lb

## 2017-11-15 DIAGNOSIS — M2242 Chondromalacia patellae, left knee: Secondary | ICD-10-CM | POA: Diagnosis not present

## 2017-11-15 NOTE — Progress Notes (Signed)
Office Visit Note   Patient: Morgan Rivera           Date of Birth: [birth year]           MRN: [id number] Visit Date: 11/15/2017              Requested by: [name], [name], FNP [address] [place], [place] [address] PCP: [name], [name], FNP   Assessment & Plan: Visit Diagnoses:  1. Chondromalacia patellae, left knee     Plan: Patient needs to get gym and do pool exercises that will not bother her knee or her back and work on weight loss, dieting as well as core strengthening.  Just exercises she should avoid for her malaise at the present primarily in the medial compartment.  MRI scan is reviewed and gave her a copy of the report.  We will check her back again in 3 months recheck her weight on return.  We discussed at this point that arthroscopic surgery is not recommended and she does not have severe enough degenerative changes to consider total knee arthroplasty.  We discussed the goal of getting her BMI below 40 she will work on this.  Follow-Up Instructions: Return in about 3 months (around 02/14/2018).   Orders:  No orders of the defined types were placed in this encounter.  No orders of the defined types were placed in this encounter.     Procedures: No procedures performed   Clinical Data: No additional findings.   Subjective: Chief Complaint  Patient presents with  . Left Knee - Pain, Follow-up    MRI review    HPI patient returns with ongoing problems with her left knee that did not respond to anti-inflammatories and gave her relief with intra-articular injection but quickly wore off with recurrent symptoms in her knee.  She has been through physical therapy without improvement.  He did not have hyperglycemia problems with the injection.  She fell on the steps in February with persistent problems since that time.  Had several different anti-inflammatories, used ice and heat.  She has increased BMI.  Plain radiographs were negative an MRI scan is  available for review.  Review of Systems 14 point review of systems updated.  09/27/2017 office note.  BMI is 50 .  She has a ESI lumbar that have been ordered by Dr. [name] for bulging lumbar disks upcoming  shortly.  Otherwise negative as it pertains to HPI.   Objective: Vital Signs: BP (!) 145/80   Pulse 85   Ht 5\' 1"  (1.549 m)   Wt 265 lb (120.2 kg)   BMI 50.07 kg/m   Physical Exam  Constitutional: She is oriented to person, place, and time. She appears well-developed.  HENT:  Head: Normocephalic.  Right Ear: External ear normal.  Left Ear: External ear normal.  Eyes: Pupils are equal, round, and reactive to light.  Neck: No tracheal deviation present. No thyromegaly present.  Cardiovascular: Normal rate.  Pulmonary/Chest: Effort normal.  Abdominal: Soft.  Neurological: She is alert and oriented to person, place, and time.  Skin: Skin is warm and dry.  Psychiatric: She has a normal mood and affect. Her behavior is normal.    Ortho Exam she has no knee effusion she has some medial joint line tenderness full extension.  No definite effusion is noted today.  Cyst minimally tender popliteal region.  Distal pulses are intact.  Negative Homan.  No rash over exposed skin.  No pain with hip  range of motion.  Specialty Comments:  No specialty comments available.  Imaging: MRI scan 11/07/2017.  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   PMFS History: Patient Active Problem List   Diagnosis Date Noted  . Chondromalacia patellae, left knee 09/27/2017  . Type 2 diabetes mellitus (HCC) 01/20/2016  . Essential hypertension 12/15/2015  . GAD (generalized anxiety disorder) 12/15/2015  . Prediabetes 12/15/2015  . Morbid obesity (HCC) 05/06/2014   Past Medical History:  Diagnosis Date  . Anxiety   . Depression   . Diabetes mellitus without complication (HCC)   . GERD (gastroesophageal reflux disease)   .  Hypertension   . Migraines   . Prediabetes   . Sleep apnea     Family History  Problem Relation Age of Onset  . Diabetes Mother 6660  . Cancer Mother        hx of skin cancer  . Heart disease Mother   . Hypertension Mother   . Diabetes Father 1670  . Cancer Father 5850       prostate  . Heart disease Maternal Grandfather   . Diabetes Maternal Grandfather   . Diabetes Paternal Grandmother     Past Surgical History:  Procedure Laterality Date  . BREATH TEK H PYLORI N/A 05/25/2014   Procedure: BREATH TEK H PYLORI;  Surgeon: Kandis Cockingavid H Newman, MD;  Location: [facility];  Service: General;  Laterality: N/A;  . CARPAL TUNNEL RELEASE    . CHOLECYSTECTOMY    . FOOT BONE EXCISION    . SHOULDER SURGERY  2016   frozen shoulder  . TUBAL LIGATION     Social History   Occupational History  . Occupation: Unemployed  Tobacco Use  . Smoking status: Never Smoker  . Smokeless tobacco: Never Used  Substance and Sexual Activity  . Alcohol use: No  . Drug use: No  . Sexual activity: Yes    Birth control/protection: Surgical

## 2018-02-14 ENCOUNTER — Ambulatory Visit (INDEPENDENT_AMBULATORY_CARE_PROVIDER_SITE_OTHER): Payer: BLUE CROSS/BLUE SHIELD | Admitting: Orthopaedic Surgery

## 2018-02-21 ENCOUNTER — Ambulatory Visit (INDEPENDENT_AMBULATORY_CARE_PROVIDER_SITE_OTHER): Payer: BLUE CROSS/BLUE SHIELD | Admitting: Orthopaedic Surgery

## 2018-02-21 ENCOUNTER — Encounter (INDEPENDENT_AMBULATORY_CARE_PROVIDER_SITE_OTHER): Payer: Self-pay | Admitting: Orthopaedic Surgery

## 2018-02-21 VITALS — BP 157/77 | HR 87

## 2018-02-21 DIAGNOSIS — M2242 Chondromalacia patellae, left knee: Secondary | ICD-10-CM

## 2018-02-21 DIAGNOSIS — M5126 Other intervertebral disc displacement, lumbar region: Secondary | ICD-10-CM | POA: Diagnosis not present

## 2018-02-21 NOTE — Progress Notes (Signed)
Office Visit Note   Patient: Morgan Rivera           Date of Birth: 1966/09/19           MRN: [id number] Visit Date: 02/21/2018              Requested by: [name], [name], FNP [address] [place], [place] [address] PCP: [name], [name], FNP   Assessment & Plan: Visit Diagnoses:  1. Chondromalacia patellae, left knee   2. Protrusion of lumbar intervertebral disc     Plan: Discussed with her that a lot of her pain may be coming from the left L2-3 disc protrusion and she can talk with Dr.Cabell about this who is followed her.  Aerobics to work on weight loss should not bother her back or her knees.  Follow-up here on an as-needed basis.  Follow-Up Instructions: No Follow-up on file.   Orders:  No orders of the defined types were placed in this encounter.  No orders of the defined types were placed in this encounter.     Procedures: No procedures performed   Clinical Data: No additional findings.   Subjective: Chief Complaint  Patient presents with  . Left Knee - Pain, Follow-up    HPI patient returns with ongoing problems with her left knee.  She has had several  series of 3 ESI injections for the lumbar spine.  Eyes any catching in her knee.  Most of the pain is in mid to distal thigh associated with some anterior thigh numbness.  Feet are bending she has increased thigh numbness and left knee pain.  Department arthritis and some chondromalacia patellofemoral compartment.  Knee injection she states really did not seem to help.  He also has noticed some symptoms on the right leg but this is mostly lateral calf sometimes into the foot.  MRI showed right L5-S1 fusion not as significant as her left L2-3 level.  States she is gained some weight since her injections.  Review of Systems updated unchanged from 09/27/2017 office visit 14 point systems reviewed.   Objective: Vital Signs: BP (!) 157/77   Pulse 87   Physical Exam  Constitutional: She is  oriented to person, place, and time. She appears well-developed.  HENT:  Head: Normocephalic.  Right Ear: External ear normal.  Left Ear: External ear normal.  Eyes: Pupils are equal, round, and reactive to light.  Neck: No tracheal deviation present. No thyromegaly present.  Cardiovascular: Normal rate.  Pulmonary/Chest: Effort normal.  Abdominal: Soft.  Neurological: She is alert and oriented to person, place, and time.  Skin: Skin is warm and dry.  Psychiatric: She has a normal mood and affect. Her behavior is normal.    Ortho Exam patient has mild crepitus with knee range of motion.  No pain with hip range of motion.  Straight leg raising 90 degrees.  Anterior tib gastrocsoleus is strong.  Normal heel toe gait negative Trendelenburg. Specialty Comments:  No specialty comments available.  Imaging: No results found.   PMFS History: Patient Active Problem List   Diagnosis Date Noted  . Chondromalacia patellae, left knee 09/27/2017  . Type 2 diabetes mellitus (HCC) 01/20/2016  . Essential hypertension 12/15/2015  . GAD (generalized anxiety disorder) 12/15/2015  . Prediabetes 12/15/2015  . Morbid obesity (HCC) 05/06/2014   Past Medical History:  Diagnosis Date  . Anxiety   . Depression   . Diabetes mellitus without complication (HCC)   . GERD (gastroesophageal reflux disease)   .  Hypertension   . Migraines   . Prediabetes   . Sleep apnea     Family History  Problem Relation Age of Onset  . Diabetes Mother 39  . Cancer Mother        hx of skin cancer  . Heart disease Mother   . Hypertension Mother   . Diabetes Father 56  . Cancer Father 71       prostate  . Heart disease Maternal Grandfather   . Diabetes Maternal Grandfather   . Diabetes Paternal Grandmother     Past Surgical History:  Procedure Laterality Date  . BREATH TEK H PYLORI N/A 05/25/2014   Procedure: BREATH TEK H PYLORI;  Surgeon: Kandis Cocking, MD;  Location: [facility];  Service: General;   Laterality: N/A;  . CARPAL TUNNEL RELEASE    . CHOLECYSTECTOMY    . FOOT BONE EXCISION    . SHOULDER SURGERY  2016   frozen shoulder  . TUBAL LIGATION     Social History   Occupational History  . Occupation: Unemployed  Tobacco Use  . Smoking status: Never Smoker  . Smokeless tobacco: Never Used  Substance and Sexual Activity  . Alcohol use: No  . Drug use: No  . Sexual activity: Yes    Birth control/protection: Surgical

## 2018-11-28 ENCOUNTER — Encounter (HOSPITAL_COMMUNITY): Payer: Self-pay | Admitting: Genetic Counselor

## 2018-11-28 ENCOUNTER — Other Ambulatory Visit (HOSPITAL_COMMUNITY): Payer: Self-pay

## 2018-12-18 DIAGNOSIS — J40 Bronchitis, not specified as acute or chronic: Secondary | ICD-10-CM

## 2018-12-18 HISTORY — DX: Bronchitis, not specified as acute or chronic: J40

## 2018-12-26 ENCOUNTER — Inpatient Hospital Stay (HOSPITAL_COMMUNITY): Payer: PRIVATE HEALTH INSURANCE

## 2018-12-26 ENCOUNTER — Inpatient Hospital Stay (HOSPITAL_COMMUNITY): Payer: PRIVATE HEALTH INSURANCE | Attending: Genetic Counselor | Admitting: Genetic Counselor

## 2018-12-26 DIAGNOSIS — Z8049 Family history of malignant neoplasm of other genital organs: Secondary | ICD-10-CM

## 2018-12-26 DIAGNOSIS — Z808 Family history of malignant neoplasm of other organs or systems: Secondary | ICD-10-CM

## 2018-12-26 DIAGNOSIS — Z8042 Family history of malignant neoplasm of prostate: Secondary | ICD-10-CM

## 2018-12-26 DIAGNOSIS — Z803 Family history of malignant neoplasm of breast: Secondary | ICD-10-CM

## 2018-12-30 ENCOUNTER — Encounter (HOSPITAL_COMMUNITY): Payer: Self-pay | Admitting: Genetic Counselor

## 2018-12-30 DIAGNOSIS — Z8049 Family history of malignant neoplasm of other genital organs: Secondary | ICD-10-CM | POA: Insufficient documentation

## 2018-12-30 DIAGNOSIS — Z803 Family history of malignant neoplasm of breast: Secondary | ICD-10-CM | POA: Insufficient documentation

## 2018-12-30 DIAGNOSIS — Z8042 Family history of malignant neoplasm of prostate: Secondary | ICD-10-CM | POA: Insufficient documentation

## 2018-12-30 DIAGNOSIS — Z808 Family history of malignant neoplasm of other organs or systems: Secondary | ICD-10-CM | POA: Insufficient documentation

## 2018-12-30 NOTE — Progress Notes (Signed)
REFERRING PROVIDER: Marva Panda, NP [facility] [address] [place], [place] [address]  PRIMARY PROVIDER:  Veryl Speak, FNP  PRIMARY REASON FOR VISIT:  1. Family history of breast cancer   2. Family history of prostate cancer   3. Family history of melanoma   4. Family history of uterine cancer      HISTORY OF PRESENT ILLNESS:   [name], a 53 y.o. female, was seen for a [place] cancer genetics consultation at the request of Dr. Fredrik Cove due to a family history of cancer.  Ms. Werlein presents to clinic today to discuss the possibility of a hereditary predisposition to cancer, genetic testing, and to further clarify her future cancer risks, as well as potential cancer risks for family members.   [name] is a 52 y.o. female with no personal history of cancer.  Several family members have undergone genetic testing and were identified as having a single, heterozygous pathogenic mutation in MUTYH.  This means that they are carriers for the autosomal recessive colon cancer syndrome called MYH-Associated Polyposis syndrome (MAP).    CANCER HISTORY:   No history exists.     HORMONAL RISK FACTORS:  Menarche was at age 40.  First live birth at age 36.  OCP use for approximately 26 years.  Ovaries intact: yes.  Hysterectomy: no.  Menopausal status: postmenopausal.  HRT use: 0 years. Colonoscopy: no; not examined. Mammogram within the last year: yes. Number of breast biopsies: 0. Up to date with pelvic exams:  yes. Any excessive radiation exposure in the past:  no  Past Medical History:  Diagnosis Date  . Anxiety   . Depression   . Diabetes mellitus without complication (HCC)   . Family history of breast cancer   . Family history of melanoma   . Family history of prostate cancer   . Family history of uterine cancer   . GERD (gastroesophageal reflux disease)   . Hypertension   . Migraines   . Prediabetes   . Sleep apnea     Past  Surgical History:  Procedure Laterality Date  . BREATH TEK H PYLORI N/A 05/25/2014   Procedure: BREATH TEK H PYLORI;  Surgeon: Kandis Cocking, MD;  Location: [facility];  Service: General;  Laterality: N/A;  . CARPAL TUNNEL RELEASE    . CHOLECYSTECTOMY    . FOOT BONE EXCISION    . SHOULDER SURGERY  2016   frozen shoulder  . TUBAL LIGATION      Social History   Socioeconomic History  . Marital status: Married    Spouse name: Not on file  . Number of children: 1  . Years of education: 9  . Highest education level: Not on file  Occupational History  . Occupation: Unemployed  Social Needs  . Financial resource strain: Not on file  . Food insecurity:    Worry: Not on file    Inability: Not on file  . Transportation needs:    Medical: Not on file    Non-medical: Not on file  Tobacco Use  . Smoking status: Never Smoker  . Smokeless tobacco: Never Used  Substance and Sexual Activity  . Alcohol use: No  . Drug use: No  . Sexual activity: Yes    Birth control/protection: Surgical  Lifestyle  . Physical activity:    Days per week: Not on file    Minutes per session: Not on file  . Stress: Not on file  Relationships  . Social  connections:    Talks on phone: Not on file    Gets together: Not on file    Attends religious service: Not on file    Active member of club or organization: Not on file    Attends meetings of clubs or organizations: Not on file    Relationship status: Not on file  Other Topics Concern  . Not on file  Social History Narrative   Fun: Crafts and music   Denies abuse and feels safe at home.      FAMILY HISTORY:  We obtained a detailed, 4-generation family history.  Significant diagnoses are listed below: Family History  Problem Relation Age of Onset  . Diabetes Mother 106  . Cancer Mother        hx of melanoma  . Heart disease Mother   . Hypertension Mother   . Other Mother        heterozygote MUTYH  . Diabetes Father 40  . Cancer Father 69        prostate  . Melanoma Maternal Grandmother   . Heart disease Maternal Grandfather   . Diabetes Maternal Grandfather   . Diabetes Paternal Grandmother   . Prostate cancer Paternal Grandfather   . Breast cancer Maternal Aunt   . Other Maternal Aunt        heterozygote MUTYH  . Prostate cancer Paternal Uncle   . Breast cancer Maternal Aunt 87  . Melanoma Maternal Aunt   . Other Maternal Aunt        heterozygote MUTYH  . Lung cancer Maternal Aunt   . Uterine cancer Cousin 35  . Prostate cancer Paternal Uncle   . Prostate cancer Paternal Uncle   . Prostate cancer Cousin        pat cousin  . Prostate cancer Cousin        pat cousin  . Breast cancer Other        MGMs sister  . Stomach cancer Other        MGM's brother    The patient has one daughter who is cancer free.  She is an only child.  Both parents are living.  The patient's mother had melanoma and has tested positive for the single MUTYH mutation.  She has one brother and five sisters.  Two sisters have breast cancer, one has lung cancer, one has learning difficulties, and died from unknown causes.  Several aunts and one cousin has tested positive for the MUTYH mutation.  The maternal grandparents are deceased.  The patient's father has prostate cancer.  He has four sisters and four brothers.  Three of the four brothers had prostate cancer and two cousins have a diagnosis of prostate cancer.  The paternal grandparents are deceased.  The grandmother died of complications from diabetes and the grandfather had prostate cancer.  [name] is aware of previous family history of genetic testing for hereditary cancer risks. Patient's maternal ancestors are of Caucasian descent, and paternal ancestors are of [place] descent. There is no reported Ashkenazi Jewish ancestry. There is no known consanguinity.  GENETIC COUNSELING ASSESSMENT: LYFE DEPPE is a 53 y.o. female with a family history of cancer, and  several family members have a known MUTYH pathogenic mutation which is somewhat suggestive of a carrier status for a hereditary colon cancer syndrome and predisposition to cancer. We, therefore, discussed and recommended the following at today's visit.   DISCUSSION: We discussed that her maternal family history has a cancer history.  There are several family members who have a single pathogenic mutation in MUTYH, a gene that is associated with recessive MAP.  This mutation does not explain the cancer in the maternal family.  The patient's father has prostate cancer and a strong family history of prostate cancer. There are several genes associated with prostate cancer, most have other cancers associated with them such as Breast or other common cancers.  HOXB13 is a gene that is associated with prostate cancer only.    Genetic testing is available for the MUTYH mutation within the maternal family.  Ms. Montez MoritaCarter has a 50% chance of having inherited this mutation.  Based on her family members' genetic testing, Ms. Montez MoritaCarter will receive free testing.  However, this will only test for the known mutation in her mother, and not for other mutations that may be inherited from her father, including other MUTYH mutations or mutations that could increase the risk for prostate cancer.   We reviewed the characteristics, features and inheritance patterns of hereditary cancer syndromes. We also discussed genetic testing, including the appropriate family members to test, the process of testing, insurance coverage and turn-around-time for results. We discussed the implications of a negative, positive and/or variant of uncertain significant result. We recommended Ms. Montez MoritaCarter pursue genetic testing for the targeted MUTYH pathogenic mutation.  PLAN: After considering the risks, benefits, and limitations, Ms. Montez MoritaCarter  provided informed consent to pursue genetic testing and the blood sample was sent to [facility] for analysis of  the MUTYH pathogenic mutation. Results should be available within approximately 2-3 weeks' time, at which point they will be disclosed by telephone to Ms. Montez MoritaCarter, as will any additional recommendations warranted by these results. Ms. Montez MoritaCarter will receive a summary of her genetic counseling visit and a copy of her results once available. This information will also be available in Epic. We encouraged Ms. Montez MoritaCarter to remain in contact with cancer genetics annually so that we can continuously update the family history and inform her of any changes in cancer genetics and testing that may be of benefit for her family. Ms. Celene SkeenCarter's questions were answered to her satisfaction today. Our contact information was provided should additional questions or concerns arise.  Lastly, we encouraged Ms. Montez MoritaCarter to remain in contact with cancer genetics annually so that we can continuously update the family history and inform her of any changes in cancer genetics and testing that may be of benefit for this family.   Ms.  Celene SkeenCarter's questions were answered to her satisfaction today. Our contact information was provided should additional questions or concerns arise. Thank you for the referral and allowing us to share in the care of your patient.   Merced Brougham P. Lowell GuitarPowell, MS, [facility] Certified Genetic Counselor [email or web address] phone: [phone]  The patient was seen for a total of 35 minutes in face-to-face genetic counseling.  This patient was discussed with Drs. Magrinat, Pamelia HoitGudena and/or Mosetta PuttFeng who agrees with the above.    _______________________________________________________________________ For Office Staff:  Number of people involved in session: 1 Was an Intern/ student involved with case: no

## 2019-02-13 ENCOUNTER — Encounter: Payer: Self-pay | Admitting: Genetic Counselor

## 2019-02-13 ENCOUNTER — Telehealth: Payer: Self-pay | Admitting: Genetic Counselor

## 2019-02-13 DIAGNOSIS — Z1379 Encounter for other screening for genetic and chromosomal anomalies: Secondary | ICD-10-CM | POA: Insufficient documentation

## 2019-02-13 NOTE — Telephone Encounter (Signed)
LM on VM that results are back and to please call to discuss. 

## 2019-02-18 NOTE — Telephone Encounter (Signed)
LM on VM that results are back and to please call. 

## 2019-02-20 ENCOUNTER — Ambulatory Visit: Payer: Self-pay | Admitting: Genetic Counselor

## 2019-02-20 DIAGNOSIS — Z1379 Encounter for other screening for genetic and chromosomal anomalies: Secondary | ICD-10-CM

## 2019-02-20 NOTE — Progress Notes (Signed)
HPI: Ms. Inglis was previously seen in the [facility] due to a family of cancer and concerns regarding a hereditary predisposition to cancer. Please refer to our prior cancer genetics clinic note for more information regarding Ms. Camba's medical, social and family histories, and our assessment and recommendations, at the time. Ms. Terlecki recent genetic test results were disclosed to her, as were recommendations warranted by these results. These results and recommendations are discussed in more detail below.   FAMILY HISTORY:  We obtained a detailed, 4-generation family history.  Significant diagnoses are listed below: Family History  Problem Relation Age of Onset  . Diabetes Mother 44  . Cancer Mother        hx of melanoma  . Heart disease Mother   . Hypertension Mother   . Other Mother        heterozygote MUTYH  . Diabetes Father 19  . Cancer Father 75       prostate  . Melanoma Maternal Grandmother   . Heart disease Maternal Grandfather   . Diabetes Maternal Grandfather   . Diabetes Paternal Grandmother   . Prostate cancer Paternal Grandfather   . Breast cancer Maternal Aunt   . Other Maternal Aunt        heterozygote MUTYH  . Prostate cancer Paternal Uncle   . Breast cancer Maternal Aunt 37  . Melanoma Maternal Aunt   . Other Maternal Aunt        heterozygote MUTYH  . Lung cancer Maternal Aunt   . Uterine cancer Cousin 35  . Prostate cancer Paternal Uncle   . Prostate cancer Paternal Uncle   . Prostate cancer Cousin        pat cousin  . Prostate cancer Cousin        pat cousin  . Breast cancer Other        MGMs sister  . Stomach cancer Other        MGM's brother    The patient has one daughter who is cancer free.  She is an only child.  Both parents are living.  The patient's mother had melanoma and has tested positive for the single MUTYH mutation.  She has one brother and five sisters.  Two sisters have breast cancer, one has lung cancer,  one has learning difficulties, and died from unknown causes.  Several aunts and one cousin has tested positive for the MUTYH mutation.  The maternal grandparents are deceased.  The patient's father has prostate cancer.  He has four sisters and four brothers.  Three of the four brothers had prostate cancer and two cousins have a diagnosis of prostate cancer.  The paternal grandparents are deceased.  The grandmother died of complications from diabetes and the grandfather had prostate cancer.  Ms. Frasch is aware of previous family history of genetic testing for hereditary cancer risks. Patient's maternal ancestors are of Caucasian descent, and paternal ancestors are of [place] descent. There is no reported Ashkenazi Jewish ancestry. There is no known consanguinity.  GENETIC TEST RESULTS: At the time of Ms. Maqueda's visit, we recommended she pursue genetic testing of the MUTYH familial mutation. This test, which included sequencing and deletion/duplication analysis of the genes listed on the test report, was performed at [facility]. Ms. Ohaver was called today with her genetic test results. 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  Since Ms. Haugh has only one pathogenic mutation in MUTYH, she is NOT affected  with MYH-associated polyposis, but instead is a carrier. A copy of the test report has been scanned into Epic and is located under the Molecular Pathology section of the Results Review tab.   ADDITIONAL GENETIC TESTING: Ms Sorola has a significant family history of prostate cancer on the paternal side of the family.  We discussed with Ms.Meincke that there are other genes that are associated with increased cancer risk that can be analyzed. While we are not worried about her personal risk for cancer based on the paternal history, grandson's could be at a higher risk for prostate cancer if there is a hereditary mutation in a  gene that increases the risk for prostate cancer.  The laboratories that offer such testing look at these additional genes via a hereditary cancer gene panel. Should Ms. Starzynski wish to pursue additional genetic testing, we are happy to discuss and coordinate this testing, at any time.    SCREENING RECOMMENDATIONS: We discussed the implications of a heterozygous MUTYH mutation for Ms. Montez Morita, and discussed who else in the family should have genetic testing. We recommended Ms. Niskanen follow the most updated medical management guidelines (NCCN Guidelines v3.2019) for heterozygous MUTYH mutations; all of which are outlined below. These can be coordinated by Ms. Cislo's GI doctor or her primary provider.   Personal history of colon cancer  Follow instructions provided by your physician based on your personal history.  Do not have a personal history of colon cancer but have a parent/sibling/child with colon cancer: Colonoscopy every 5 years starting at age 80 or 65 years younger than the earliest age of onset, whichever is younger.  Do not have a personal history of colon cancer but do not have a parent/sibling/child with colon cancer: Data is uncertain on whether specialized screening is warranted.Marland Kitchen  FAMILY MEMBERS: Since we now know the mutation in Ms. Montez Morita, we can test at-risk relatives to determine whether or not they have inherited the mutation and are at increased risk for cancer. We will be happy to meet with any of the family members or refer them to a genetic counselor in their local area. To locate genetic counselors in other cities, individuals can visit the website of the [employer] ([email or web address]) and Financial controller for a Veterinary surgeon by zip code.   We strongly encouraged Ms. Suchan to remain in contact with [place] in cancer genetics on an annual basis so we can update Ms. Zahradnik's personal and family histories, and inform her of advances in cancer genetics that may be of benefit  for the entire family. Ms. Melnyk knows she is also welcome to call with any questions or concerns, at any time.   Maylon Cos, MS, [facility]  Certified Genetic Counselor  [phone]

## 2019-02-20 NOTE — Telephone Encounter (Signed)
Revealed positive genetic testing for the single heterozygous MUTYH mutation that is running in her family. I explained that with her previously negative colonoscopies, that she is most likely a carrier and not affected with MYH assciated polyposis.  We recommend that her daughter undergo genetic testing and get a panel test to look at both MUTYH genes.  I released a copy of her test report to her.

## 2019-02-21 ENCOUNTER — Encounter: Payer: Self-pay | Admitting: Genetic Counselor

## 2019-02-21 ENCOUNTER — Ambulatory Visit: Payer: Self-pay | Admitting: Genetic Counselor

## 2019-02-21 DIAGNOSIS — Z1379 Encounter for other screening for genetic and chromosomal anomalies: Secondary | ICD-10-CM

## 2019-02-21 NOTE — Progress Notes (Signed)
error 

## 2021-07-14 ENCOUNTER — Ambulatory Visit (INDEPENDENT_AMBULATORY_CARE_PROVIDER_SITE_OTHER): Payer: Medicare HMO | Admitting: Orthopaedic Surgery

## 2021-07-14 ENCOUNTER — Ambulatory Visit (INDEPENDENT_AMBULATORY_CARE_PROVIDER_SITE_OTHER): Payer: Medicare HMO

## 2021-07-14 ENCOUNTER — Other Ambulatory Visit: Payer: Self-pay

## 2021-07-14 VITALS — Ht 61.0 in | Wt 246.0 lb

## 2021-07-14 DIAGNOSIS — M25562 Pain in left knee: Secondary | ICD-10-CM

## 2021-07-14 DIAGNOSIS — G8929 Other chronic pain: Secondary | ICD-10-CM

## 2021-07-14 DIAGNOSIS — M1712 Unilateral primary osteoarthritis, left knee: Secondary | ICD-10-CM

## 2021-07-14 DIAGNOSIS — Z6841 Body Mass Index (BMI) 40.0 and over, adult: Secondary | ICD-10-CM

## 2021-07-14 MED ORDER — BUPIVACAINE HCL 0.5 % IJ SOLN
3.0000 mL | INTRAMUSCULAR | Status: AC | PRN
  Administered 2021-07-14: 3 mL via INTRA_ARTICULAR

## 2021-07-14 MED ORDER — LIDOCAINE HCL 1 % IJ SOLN
0.5000 mL | INTRAMUSCULAR | Status: AC | PRN
  Administered 2021-07-14: .5 mL

## 2021-07-14 MED ORDER — METHYLPREDNISOLONE ACETATE 40 MG/ML IJ SUSP
40.0000 mg | INTRAMUSCULAR | Status: AC | PRN
  Administered 2021-07-14: 40 mg via INTRA_ARTICULAR

## 2021-07-14 NOTE — Progress Notes (Signed)
Office Visit Note   Patient: Morgan Rivera           Date of Birth: [birth year]           MRN: [id number] Visit Date: 07/14/2021              Requested by: Veryl Speak, FNP [address] [place],  [place] [address] PCP: Veryl Speak, FNP   Assessment & Plan: Visit Diagnoses:  1. Chronic pain of left knee   2. Unilateral primary osteoarthritis, left knee   3. Body mass index 40.0-44.9, adult (HCC)     Plan: Left knee injection performed which she tolerated well.  She will follow-up on an as-needed basis.  She will work on weight loss for morbid obesity goal is 211 pounds or last.  X-ray results were reviewed.  Total knee arthroplasty discussed.  Follow-Up Instructions: Return if symptoms worsen or fail to improve.   Orders:  Orders Placed This Encounter  Procedures   Large Joint Inj: L knee   XR KNEE 3 VIEW LEFT   No orders of the defined types were placed in this encounter.     Procedures: Large Joint Inj: L knee on 07/14/2021 11:04 AM Indications: joint swelling and pain Details: 22 G 1.5 in needle, anterolateral approach  Arthrogram: No  Medications: 0.5 mL lidocaine 1 %; 3 mL bupivacaine 0.5 %; 40 mg methylPREDNISolone acetate 40 MG/ML Outcome: tolerated well, no immediate complications Procedure, treatment alternatives, risks and benefits explained, specific risks discussed. Consent was given by the patient. Immediately prior to procedure a time out was called to verify the correct patient, procedure, equipment, support staff and site/side marked as required. Patient was prepped and draped in the usual sterile fashion.      Clinical Data: No additional findings.   Subjective: Chief Complaint  Patient presents with   Left Knee - Pain    HPI  55 year old female seen with left knee pain significantly worse than right.  Past history of injection in her knee 2019 which gave her significant relief for many months.  She had a previous noted  Baker's cyst.  She not able to walk a lot due to knee pain with swelling.  BMI 46 and her goal weight is less than 211.  Current weight is 246.  Patient denies walking.  She does have diabetes takes meloxicam and gabapentin for her back problems.  Review of Systems positive type 2 diabetes left greater than right knee osteoarthritis.  Morbid obesity.  Negative for stroke or MI.  Positive for hypertension.  All other systems noncontributory 14 point system update.   Objective: Vital Signs: Ht 5\' 1"  (1.549 m)   Wt 246 lb (111.6 kg)   BMI 46.48 kg/m   Physical Exam Constitutional:      Appearance: She is well-developed.  HENT:     Head: Normocephalic.     Right Ear: External ear normal.     Left Ear: External ear normal. There is no impacted cerumen.  Eyes:     Pupils: Pupils are equal, round, and reactive to light.  Neck:     Thyroid: No thyromegaly.     Trachea: No tracheal deviation.  Cardiovascular:     Rate and Rhythm: Normal rate.  Pulmonary:     Effort: Pulmonary effort is normal.  Abdominal:     Palpations: Abdomen is soft.  Musculoskeletal:     Cervical back: No rigidity.  Skin:    General: Skin  is warm and dry.  Neurological:     Mental Status: She is alert and oriented to person, place, and time.  Psychiatric:        Behavior: Behavior normal.    Ortho Exam patient has medial joint line tenderness negative logroll of the hips negative straight leg raising 90 degrees.  Negative patellar subluxation ACL PCL exam is normal.  Distal pulses are palpable.  Specialty Comments:  No specialty comments available.  Imaging: XR KNEE 3 VIEW LEFT  Result Date: 07/14/2021 Standing AP both knees lateral left knee bilateral sunrise x-ray demonstrates left knee osteoarthritis with medial joint space narrowing patellofemoral spurring and mild lateral spurring Impression: Moderate left knee osteoarthritis minimal right knee osteoarthritis.    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   Type 2 diabetes mellitus (HCC) 01/20/2016   Essential hypertension 12/15/2015   GAD (generalized anxiety disorder) 12/15/2015   Prediabetes 12/15/2015   Morbid obesity (HCC) 05/06/2014   Past Medical History:  Diagnosis Date   Anxiety    Depression    Diabetes mellitus without complication (HCC)    Family history of breast cancer    Family history of breast cancer    Family history of melanoma    Family history of prostate cancer    Family history of uterine cancer    GERD (gastroesophageal reflux disease)    Hypertension    Migraines    Prediabetes    Sleep apnea     Family History  Problem Relation Age of Onset   Diabetes Mother 57   Cancer Mother        hx of melanoma   Heart disease Mother    Hypertension Mother    Other Mother        heterozygote MUTYH   Diabetes Father 76   Cancer Father 24       prostate   Melanoma Maternal Grandmother    Heart disease Maternal Grandfather    Diabetes Maternal Grandfather    Diabetes Paternal Grandmother    Prostate cancer Paternal Grandfather    Breast cancer Maternal Aunt    Other Maternal Aunt        heterozygote MUTYH   Prostate cancer Paternal Uncle    Breast cancer Maternal Aunt 68   Melanoma Maternal Aunt    Other Maternal Aunt        heterozygote MUTYH   Lung cancer Maternal Aunt    Uterine cancer Cousin 35   Prostate cancer Paternal Uncle    Prostate cancer Paternal Uncle    Prostate cancer Cousin        pat cousin   Prostate cancer Cousin        pat cousin   Breast cancer Other        MGMs sister   Stomach cancer Other        MGM's brother    Past Surgical History:  Procedure Laterality Date   BREATH TEK H PYLORI N/A 05/25/2014   Procedure: BREATH  TEK H PYLORI;  Surgeon: Kandis Cocking, MD;  Location: [facility];  Service: General;  Laterality: N/A;   CARPAL TUNNEL RELEASE     CHOLECYSTECTOMY     FOOT BONE EXCISION  SHOULDER SURGERY  2016   frozen shoulder   TUBAL LIGATION     Social History   Occupational History   Occupation: Unemployed  Tobacco Use   Smoking status: Never   Smokeless tobacco: Never  Substance and Sexual Activity   Alcohol use: No   Drug use: No   Sexual activity: Yes    Birth control/protection: Surgical

## 2021-08-29 ENCOUNTER — Encounter: Payer: Self-pay | Admitting: Emergency Medicine

## 2021-08-29 ENCOUNTER — Ambulatory Visit: Payer: Medicare HMO

## 2021-08-29 ENCOUNTER — Other Ambulatory Visit: Payer: Self-pay

## 2021-08-29 ENCOUNTER — Ambulatory Visit
Admission: EM | Admit: 2021-08-29 | Discharge: 2021-08-29 | Disposition: A | Discharge status: Home / Self Care | Payer: Medicare HMO | Attending: Family | Admitting: Family

## 2021-08-29 DIAGNOSIS — L255 Unspecified contact dermatitis due to plants, except food: Secondary | ICD-10-CM | POA: Diagnosis not present

## 2021-08-29 DIAGNOSIS — I10 Essential (primary) hypertension: Secondary | ICD-10-CM

## 2021-08-29 MED ORDER — METHYLPREDNISOLONE SODIUM SUCC 125 MG IJ SOLR
125.0000 mg | Freq: Once | INTRAMUSCULAR | Status: AC
Start: 2021-08-29 — End: 2021-08-29
  Administered 2021-08-29: 125 mg via INTRAMUSCULAR

## 2021-08-29 NOTE — ED Triage Notes (Signed)
Poison oak rash x 2-3 days

## 2021-08-29 NOTE — ED Provider Notes (Signed)
[facility]    CSN: [id number] Arrival date & time: 08/29/21  1226      History   Chief Complaint Chief Complaint  Patient presents with   Rash    HPI Morgan Rivera is a 55 y.o. female.   55 year old female presents with exposure to poison oak/ivy 3 to 4 days ago. Was working in the yard in short sleeves and no gloves. Started with a few lesions on her right arm 3 days ago and now has spread to her chest, especially near her breasts, back, neck and lower arms. No distinct lesions on her legs. Very itchy. Not painful. No fever or URI symptoms. Has tried OTC Cortisone cream and Calamine lotion with minimal relief. Usually gets contact dermatitis every year. Usually steroid shot helps. Other chronic health issues include HTN, hyperlipidemia, type 2 DM, anxiety and depression. Currently on Accupril, Lipitor, Metformin, Celexa, Nortriptyline, and Meloxicam daily.   The history is provided by the patient.   Past Medical History:  Diagnosis Date   Anxiety    Depression    Diabetes mellitus without complication (HCC)    Family history of breast cancer    Family history of breast cancer    Family history of melanoma    Family history of prostate cancer    Family history of uterine cancer    GERD (gastroesophageal reflux disease)    Hypertension    Migraines    Prediabetes    Sleep apnea     Patient Active Problem List   Diagnosis Date Noted   Genetic testing 02/13/2019   Family history of breast cancer    Family history of prostate cancer    Family history of melanoma    Family history of uterine cancer    Chondromalacia patellae, 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      Home Medications    Prior to Admission medications   Medication Sig Start Date End Date Taking? Authorizing Provider  atorvastatin (LIPITOR) 10 MG tablet Take 10 mg by mouth daily. 07/07/21   [provider]  citalopram (CELEXA) 40 MG tablet Take 1 tablet (40 mg total) by mouth daily. 11/02/16   Veryl Speak, FNP  meloxicam (MOBIC) 7.5 MG tablet meloxicam 7.5 mg tablet    [provider]  metFORMIN (GLUCOPHAGE) 500 MG tablet Take 1 tablet (500 mg total) by mouth 2 (two) times daily with a meal. 05/24/16   Veryl Speak, FNP  nortriptyline (PAMELOR) 25 MG capsule Take 25 mg by mouth 2 (two) times daily. 04/25/21   [provider]  quinapril (ACCUPRIL) 40 MG tablet TAKE ONE TABLET BY MOUTH AT BEDTIME 08/22/16   Veryl Speak, FNP    Family History Family History  Problem Relation Age of Onset   Diabetes Mother 5  Cancer Mother        hx of melanoma   Heart disease Mother    Hypertension Mother    Other Mother        heterozygote MUTYH   Diabetes Father 2770   Cancer Father 6750       prostate   Melanoma Maternal Grandmother    Heart disease Maternal Grandfather    Diabetes Maternal Grandfather    Diabetes Paternal Grandmother    Prostate cancer Paternal Grandfather    Breast cancer Maternal Aunt    Other Maternal Aunt        heterozygote MUTYH   Prostate cancer Paternal Uncle    Breast cancer Maternal Aunt 68   Melanoma Maternal Aunt    Other Maternal Aunt        heterozygote MUTYH   Lung cancer Maternal Aunt    Uterine cancer Cousin 35   Prostate cancer Paternal Uncle    Prostate cancer Paternal Uncle    Prostate cancer Cousin        pat cousin   Prostate  cancer Cousin        pat cousin   Breast cancer Other        MGMs sister   Stomach cancer Other        MGM's brother    Social History Social History   Tobacco Use   Smoking status: Never   Smokeless tobacco: Never  Substance Use Topics   Alcohol use: No   Drug use: No     Allergies   Food and Hydrocodone   Review of Systems Review of Systems  Constitutional:  Negative for activity change, appetite change, chills, diaphoresis, fatigue and fever.  HENT:  Negative for facial swelling, sore throat and trouble swallowing.   Eyes:  Negative for pain, discharge and itching.  Respiratory:  Negative for cough, chest tightness, shortness of breath and wheezing.   Gastrointestinal:  Negative for nausea and vomiting.  Musculoskeletal:  Positive for arthralgias. Negative for neck pain and neck stiffness.  Skin:  Positive for color change and rash.  Allergic/Immunologic: Positive for environmental allergies. Negative for immunocompromised state.  Neurological:  Negative for dizziness, seizures, syncope, weakness, light-headedness, numbness and headaches.  Hematological:  Negative for adenopathy. Does not bruise/bleed easily.    Physical Exam Triage Vital Signs ED Triage Vitals [08/29/21 1345]  Enc Vitals Group     BP (!) 169/99     Pulse Rate (!) 112     Resp 18     Temp 98.2 F (36.8 C)     Temp Source Oral     SpO2 95 %     Weight      Height      Head Circumference      Peak Flow      Pain Score 0     Pain Loc      Pain Edu?      Excl. in GC?    No data found.  Updated Vital Signs BP (!) 169/99 (BP Location: Right Arm)   Pulse (!) 112   Temp 98.2 F (36.8 C) (Oral)   Resp 18   SpO2 95%   Visual Acuity Right Eye Distance:   Left Eye Distance:   Bilateral Distance:    Right Eye Near:   Left Eye Near:    Bilateral Near:     Physical Exam Vitals and nursing note reviewed.  Constitutional:      General: She is awake. She is not  in acute distress.     Appearance: She is well-developed and well-groomed.     Comments: She is sitting on the exam table in no acute distress but appears uncomfortable due to itchy rash.   HENT:     Head: Normocephalic and atraumatic.     Right Ear: Hearing and external ear normal.     Left Ear: Hearing and external ear normal.  Eyes:     Extraocular Movements: Extraocular movements intact.     Conjunctiva/sclera: Conjunctivae normal.  Cardiovascular:     Rate and Rhythm: Regular rhythm. Tachycardia present.     Heart sounds: Normal heart sounds.  Pulmonary:     Effort: Pulmonary effort is normal. No respiratory distress.     Breath sounds: Normal breath sounds and air entry. No decreased air movement. No decreased breath sounds, wheezing, rhonchi or rales.  Musculoskeletal:        General: Normal range of motion.     Cervical back: Normal range of motion and neck supple.  Skin:    General: Skin is warm.     Capillary Refill: Capillary refill takes less than 2 seconds.     Findings: Rash present. No bruising, ecchymosis or petechiae. Rash is macular and papular. Rash is not pustular, scaling or vesicular.          Comments: Multiple clusters of red papular lesions present on right upper arm, upper chest and upper back along right bras strap. Also some maculopapular lesions under left and right breast- more lesions on left side. A few lesions on left side of neck. No distinct lesions on face or lower extremities. No signs of secondary infection. Initial lesions on right upper arm are slightly crusted over.   Neurological:     General: No focal deficit present.     Mental Status: She is alert and oriented to person, place, and time.  Psychiatric:        Mood and Affect: Mood normal.        Behavior: Behavior normal. Behavior is cooperative.        Thought Content: Thought content normal.        Judgment: Judgment normal.     UC Treatments / Results  Labs (all labs ordered are listed, but only abnormal  results are displayed) Labs Reviewed - No data to display  EKG   Radiology No results found.  Procedures Procedures (including critical care time)  Medications Ordered in UC Medications  methylPREDNISolone sodium succinate (SOLU-MEDROL) 125 mg/2 mL injection 125 mg (125 mg Intramuscular Given 08/29/21 1445)    Initial Impression / Assessment and Plan / UC Course  I have reviewed the triage vital signs and the nursing notes.  Pertinent labs & imaging results that were available during my care of the patient were reviewed by me and considered in my medical decision making (see chart for details).    Reviewed with patient that she probably has contact dermatitis due to poison oak or other plant. Gave SoluMedrol 125mg  IM now to help with rash. Blood pressure elevated today probably due to discomfort. Encouraged to take blood pressure medication as prescribed and continue to monitor. May take OTC Benadryl 25mg  at night to help with itching. May take OTC Zyrtec or Claritin 10mg  during the day to help with itching and may use Calamine lotion as needed.   Discussed possible need for oral steroids. Can affect sugar levels and blood pressure. Patient declined today but if rash is not improving within 2 to  3 days, may call Urgent Care and we will call in Prednisone 10mg  6 day taper dose pack as directed to pharmacy of choice without additional visit.  However, if rash worsens or any new symptoms develop, she will need to return to Urgent Care for re-evaluation.  Final Clinical Impressions(s) / UC Diagnoses   Final diagnoses:  Contact dermatitis due to plant  Elevated blood pressure reading in office with diagnosis of hypertension     Discharge Instructions      You were given a steroid shot SoluMedrol 125mg  now to help with poison ivy/oak. Recommend apply cool compresses to area for comfort. May take Benadryl 25mg  at  night to help with itching. May take OTC Claritin 10mg  or Zyrtec 10mg   during the day to help with itching. You may need oral steroids if rash does  not improve. Follow-up in 2 days if not improving.      ED Prescriptions   None    PDMP not reviewed this encounter.   , NP 08/30/21 1131

## 2021-08-29 NOTE — Discharge Instructions (Addendum)
You were given a steroid shot SoluMedrol 125mg  now to help with poison ivy/oak. Recommend apply cool compresses to area for comfort. May take Benadryl 25mg  at  night to help with itching. May take OTC Claritin 10mg  or Zyrtec 10mg  during the day to help with itching. You may need oral steroids if rash does  not improve. Follow-up in 2 days if not improving.

## 2021-08-31 ENCOUNTER — Telehealth: Payer: Self-pay | Admitting: Family Medicine

## 2021-08-31 MED ORDER — PREDNISONE 20 MG PO TABS
40.0000 mg | ORAL_TABLET | Freq: Every day | ORAL | 0 refills | Status: DC
Start: 2021-08-31 — End: 2021-08-31

## 2021-08-31 MED ORDER — PREDNISONE 20 MG PO TABS: 40.0000 mg | ORAL_TABLET | Freq: Every day | ORAL | 0 refills | Status: DC

## 2021-08-31 MED ORDER — PREDNISONE 10 MG (48) PO TBPK: ORAL_TABLET | ORAL | 0 refills | Status: DC

## 2021-08-31 NOTE — Telephone Encounter (Signed)
Poison ivy not better.  Sent to pharmacy: Meds ordered this encounter  Medications   predniSONE (STERAPRED UNI-PAK 48 TAB) 10 MG (48) TBPK tablet    Sig: Take as directed.    Dispense:  48 tablet    Refill:  0

## 2021-08-31 NOTE — Telephone Encounter (Signed)
Sent prednisone to different pharmacy.

## 2021-09-28 ENCOUNTER — Encounter: Payer: Self-pay | Admitting: Gastroenterology

## 2021-11-07 ENCOUNTER — Encounter: Payer: Self-pay | Admitting: Gastroenterology

## 2021-11-07 ENCOUNTER — Ambulatory Visit (AMBULATORY_SURGERY_CENTER): Payer: Medicare HMO

## 2021-11-07 VITALS — Ht 61.0 in | Wt 245.0 lb

## 2021-11-07 DIAGNOSIS — Z1211 Encounter for screening for malignant neoplasm of colon: Secondary | ICD-10-CM

## 2021-11-07 NOTE — Progress Notes (Signed)
No egg or soy allergy known to patient  No issues known to pt with past sedation with any surgeries or procedures Patient denies ever being told they had issues or difficulty with intubation  No FH of Malignant Hyperthermia Pt is not on diet pills Pt is not on  home 02  Pt is not on blood thinners  Pt denies issues with constipation  No A fib or A flutter   Miralax prep   Due to the COVID-19 pandemic we are asking patients to follow certain guidelines in [place] and the [facility]   Pt aware of COVID protocols and [facility] guidelines

## 2021-11-20 ENCOUNTER — Encounter: Payer: Self-pay | Admitting: Certified Registered Nurse Anesthetist

## 2021-11-21 ENCOUNTER — Ambulatory Visit: Payer: Medicare HMO | Admitting: Gastroenterology

## 2021-11-21 ENCOUNTER — Other Ambulatory Visit: Payer: Self-pay

## 2021-11-21 VITALS — BP 162/92 | HR 115 | Temp 98.0°F | Ht 61.0 in | Wt 245.0 lb

## 2021-11-21 MED ORDER — SODIUM CHLORIDE 0.9 % IV SOLN: 500.0000 mL | Freq: Once | INTRAVENOUS | Status: AC

## 2021-11-21 NOTE — Progress Notes (Signed)
DT - vs  Pt's states no medical or surgical changes since previsit or office visit.    While Wynona Dove, CNA was getting vs in admitting, pt began coughing, which triggered her to vomite.  Per pt she "gets food stuck in her tonsil and causes me to cough".  Pt was coughing while getting her vs and she was gagging.  This happens about once per week.  Merrilyn Puma, CRNA was notified and he spoke with pt and then with Dr. Orvan Falconer.  Per Dr. Orvan Falconer, pt needs to have an office visit and may need to have a EGD also.  The procedure should be done at the hospital per [facility], CRNA.

## 2021-12-29 ENCOUNTER — Encounter: Payer: Self-pay | Admitting: Gastroenterology

## 2021-12-29 ENCOUNTER — Ambulatory Visit: Payer: Medicare HMO | Admitting: Gastroenterology

## 2021-12-29 VITALS — BP 140/76 | HR 123 | Ht 61.0 in | Wt 248.0 lb

## 2021-12-29 DIAGNOSIS — R059 Cough, unspecified: Secondary | ICD-10-CM | POA: Diagnosis not present

## 2021-12-29 DIAGNOSIS — K219 Gastro-esophageal reflux disease without esophagitis: Secondary | ICD-10-CM | POA: Diagnosis not present

## 2021-12-29 MED ORDER — PANTOPRAZOLE SODIUM 40 MG PO TBEC: 40.0000 mg | DELAYED_RELEASE_TABLET | Freq: Every morning | ORAL | 3 refills | Status: DC

## 2021-12-29 NOTE — Patient Instructions (Addendum)
If you are age 58 or younger, your body mass index should be between 19-25. Your Body mass index is 46.86 kg/m. If this is out of the aformentioned range listed, please consider follow up with your Primary Care Provider.  ______________________________________________________  The [facility] providers would like to encourage you to use [facility] to communicate with providers for non-urgent requests or questions.  Due to long hold times on the telephone, sending your provider a message by [facility] may be a faster and more efficient way to get a response.  Please allow 48 business hours for a response.  Please remember that this is for non-urgent requests.  _______________________________________________________  Bonita Quin have been scheduled for an endoscopy and colonoscopy. Please follow the written instructions given to you at your visit today. Please pick up your prep supplies at the pharmacy within the next 1-3 days. If you use inhalers (even only as needed), please bring them with you on the day of your procedure.   Due to recent changes in healthcare laws, you may see the results of your imaging and laboratory studies on MyChart before your provider has had a chance to review them.  We understand that in some cases there may be results that are confusing or concerning to you. Not all laboratory results come back in the same time frame and the provider may be waiting for multiple results in order to interpret others.  Please give [place] 48 hours in order for your provider to thoroughly review all the results before contacting the office for clarification of your results.   We have sent the following medications to your pharmacy for you to pick up at your convenience:  START: pantoprazole 40mg  one tablet daily in the morning.   Hold Metformin on the morning of the procedure.   Thank you for entrusting me with your care and choosing [facility].  Dr [facility]

## 2021-12-29 NOTE — Progress Notes (Signed)
Referring Provider: Leeanne Rio, MD Primary Care Physician:  Leeanne Rio, MD  Reason for Consultation:  Cough   IMPRESSION:  Heterozygous MUTYH mutation: Data is uncertain about recommendations for colon cancer screening without a family history of a first degree relative with colon cancer. Will err on the side of caution and recommended colon cancer screening every 5 years.   Globus:Globus without associated pain, dysphagia, odynophagia, weight loss, a change in voice, presence of a neck or tonsillar mass, or unexplained cervical adenopathy. EGD recommended to evaluate for gastroesophageal reflux, gastric inlet pouch in the proximal esophagus, and eosinophilic esophagitis as the cause of symptoms.  If upper endoscopy is negative would recommend ENT referral given the associated unilateral neck discomfort. Trial of daily PPI therapy. Consider amitriptyline 25 mg daily if no response to PPI.                      PLAN: - Pantoprazole 40 mg QAM - EGD including esophageal and gastric biopsies - Colonoscopy -ENT referral if endoscopic evaluation is nondiagnostic     HPI: Morgan Rivera is a 56 y.o. female who presents for evaluation prior to screening colonoscopy.  She came in for colonoscopy 11/21/2021.  During the admitting process she began coughing which triggered vomiting.  Patient reported that food gets stuck in her tonsils and causes her to cough at least weekly.  The nurse anesthetist was concerned about the symptoms and felt that they needed to be evaluated prior to proceeding with monitored anesthesia care.  She presents today remaining motivated to proceed with colonoscopy.  Prior history is significant for diabetes, obstructive sleep apnea on CPAP, obesity, hypertension, and migraines.  She had a cholecystectomy with post-procedure diarrhea that ultimately resolved.  She had genetic counseling performed in 2020 given a strong family history of cancer.  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  She is not affected with MYH associated polyposis. There is no known family history of colon cancer or polyps. No family history of stomach cancer or other GI malignancy. No family history of inflammatory bowel disease or celiac.   3 months ago she had a cold with a prominent cough. She did not see a doctor at that time. Since then she feels that there is something in her throat - primarily on the right but occasionally on the left. There is associated nausea and vomiting. Frequent throat clearing. No dysphonia or sore throat.  No dysphagia or odynophagia. No change in bowel habits. No blood in the stool.   She has a longstanding history of constipation. She uses Miralax every other day with her coffee.   Colonoscopy in 2015 at [facility]. She had diverticulosis but no polyps.   No smoking. No marijuana.   Prior abdominal imaging: - Abdominal ultrasound 2004 for nausea and vomiting: Cholelithiasis, probable fatty liver - Upper GI series with [facility] 2015 for bariatric screening: Widely patent Schatzki's ring, otherwise normal   Past Medical History:  Diagnosis Date   Allergy    rosemary, pollen, seasonal   Anxiety    Bronchitis 2020   Depression    Diabetes mellitus without complication ([place])    Family history of breast cancer    Family history of breast cancer    Family history of melanoma    Family history of prostate cancer    Family history of uterine cancer    GERD (gastroesophageal reflux disease)    Hyperlipidemia  Hypertension    Migraines    Prediabetes    Sleep apnea     Past Surgical History:  Procedure Laterality Date   BREATH TEK H PYLORI N/A 05/25/2014   Procedure: BREATH TEK H PYLORI;  Surgeon: Shann Medal, MD;  Location: [facility];  Service: General;  Laterality: N/A;   CARPAL TUNNEL RELEASE     CHOLECYSTECTOMY     COLONOSCOPY     FOOT BONE EXCISION     SHOULDER SURGERY  2016   frozen shoulder    TUBAL LIGATION     Current Outpatient Medications  Medication Sig Dispense Refill   atorvastatin (LIPITOR) 10 MG tablet Take 10 mg by mouth daily.     citalopram (CELEXA) 40 MG tablet Take 1 tablet (40 mg total) by mouth daily. 90 tablet 0   meloxicam (MOBIC) 7.5 MG tablet meloxicam 7.5 mg tablet     metFORMIN (GLUCOPHAGE) 500 MG tablet Take 1 tablet (500 mg total) by mouth 2 (two) times daily with a meal. 180 tablet 1   nortriptyline (PAMELOR) 25 MG capsule Take 25 mg by mouth 2 (two) times daily.     pantoprazole (PROTONIX) 40 MG tablet Take 1 tablet (40 mg total) by mouth in the morning. 30 tablet 3   predniSONE (DELTASONE) 20 MG tablet Take 2 tablets (40 mg total) by mouth daily. 10 tablet 0   quinapril (ACCUPRIL) 40 MG tablet TAKE ONE TABLET BY MOUTH AT BEDTIME 60 tablet 2   Current Facility-Administered Medications  Medication Dose Route Frequency Provider Last Rate Last Admin   0.9 %  sodium chloride infusion  500 mL Intravenous Once Thornton Park, MD        Allergies as of 12/29/2021 - Review Complete 12/29/2021  Allergen Reaction Noted   Food  09/01/2011   Hydrocodone  11/21/2013    Family History  Problem Relation Age of Onset   Colon polyps Mother    Diabetes Mother 47   Cancer Mother        hx of melanoma   Heart disease Mother    Hypertension Mother    Other Mother        heterozygote MUTYH   Diabetes Father 26   Cancer Father 1       prostate   Breast cancer Maternal Aunt    Other Maternal Aunt        heterozygote MUTYH   Breast cancer Maternal Aunt 68   Melanoma Maternal Aunt    Other Maternal Aunt        heterozygote MUTYH   Lung cancer Maternal Aunt    Prostate cancer Paternal Uncle    Prostate cancer Paternal Uncle    Prostate cancer Paternal Uncle    Melanoma Maternal Grandmother    Heart disease Maternal Grandfather    Diabetes Maternal Grandfather    Diabetes Paternal Grandmother    Prostate cancer Paternal Grandfather    Uterine cancer  Cousin 35   Prostate cancer Cousin        pat cousin   Prostate cancer Cousin        pat cousin   Breast cancer Other        MGMs sister   Stomach cancer Other        MGM's brother   Colon cancer Neg Hx    Esophageal cancer Neg Hx    Ulcerative colitis Neg Hx      Review of Systems: 12 system ROS is negative except as noted above with  the addition of arthritis, back pain, cough, fatigue.   Physical Exam: General:   Alert,  well-nourished, pleasant and cooperative in NAD Head:  Normocephalic and atraumatic. Eyes:  Sclera clear, no icterus.   Conjunctiva pink. Ears:  Normal auditory acuity. Nose:  No deformity, discharge,  or lesions. Mouth:  No deformity or lesions.   Neck:  Supple; no masses or thyromegaly. Lungs:  Clear throughout to auscultation.   No wheezes. Heart:  Regular rate and rhythm; no murmurs. Abdomen:  Soft, nontender, nondistended, normal bowel sounds, no rebound or guarding. No hepatosplenomegaly.   Rectal:  Deferred  Msk:  Symmetrical. No boney deformities LAD: No inguinal or umbilical LAD Extremities:  No clubbing or edema. Neurologic:  Alert and  oriented x4;  grossly nonfocal Skin:  Intact without significant lesions or rashes. Psych:  Alert and cooperative. Normal mood and affect.     Kayin Osment L. Tarri Glenn, MD, MPH 01/05/2022, 8:25 AM

## 2022-01-05 ENCOUNTER — Encounter: Payer: Self-pay | Admitting: Gastroenterology

## 2022-02-10 ENCOUNTER — Encounter: Payer: Self-pay | Admitting: Gastroenterology

## 2022-02-16 ENCOUNTER — Ambulatory Visit (AMBULATORY_SURGERY_CENTER): Payer: Medicare HMO | Admitting: Gastroenterology

## 2022-02-16 ENCOUNTER — Other Ambulatory Visit: Payer: Self-pay

## 2022-02-16 ENCOUNTER — Encounter: Payer: Self-pay | Admitting: Gastroenterology

## 2022-02-16 VITALS — BP 128/59 | HR 93 | Temp 96.8°F | Resp 15 | Ht 61.0 in | Wt 248.0 lb

## 2022-02-16 DIAGNOSIS — Z1211 Encounter for screening for malignant neoplasm of colon: Secondary | ICD-10-CM | POA: Diagnosis not present

## 2022-02-16 DIAGNOSIS — R059 Cough, unspecified: Secondary | ICD-10-CM | POA: Diagnosis not present

## 2022-02-16 DIAGNOSIS — K2970 Gastritis, unspecified, without bleeding: Secondary | ICD-10-CM | POA: Diagnosis not present

## 2022-02-16 DIAGNOSIS — K229 Disease of esophagus, unspecified: Secondary | ICD-10-CM | POA: Diagnosis not present

## 2022-02-16 DIAGNOSIS — K2950 Unspecified chronic gastritis without bleeding: Secondary | ICD-10-CM | POA: Diagnosis not present

## 2022-02-16 DIAGNOSIS — K219 Gastro-esophageal reflux disease without esophagitis: Secondary | ICD-10-CM | POA: Diagnosis not present

## 2022-02-16 MED ORDER — SODIUM CHLORIDE 0.9 % IV SOLN: 500.0000 mL | Freq: Once | INTRAVENOUS | Status: DC

## 2022-02-16 NOTE — Patient Instructions (Signed)
Please read handouts provided. ?Continue present medications. ?Await pathology results. ?No aspirin, ibuprofen, naproxen, or other non-steriodal anti-inflammatory drugs. ?Repeat colonoscopy in 5 years. ? ?YOU HAD AN ENDOSCOPIC PROCEDURE TODAY AT THE [facility]:   Refer to the procedure report that was given to you for any specific questions about what was found during the examination.  If the procedure report does not answer your questions, please call your gastroenterologist to clarify.  If you requested that your care partner not be given the details of your procedure findings, then the procedure report has been included in a sealed envelope for you to review at your convenience later. ? ?YOU SHOULD EXPECT: Some feelings of bloating in the abdomen. Passage of more gas than usual.  Walking can help get rid of the air that was put into your GI tract during the procedure and reduce the bloating. If you had a lower endoscopy (such as a colonoscopy or flexible sigmoidoscopy) you may notice spotting of blood in your stool or on the toilet paper. If you underwent a bowel prep for your procedure, you may not have a normal bowel movement for a few days. ? ?Please Note:  You might notice some irritation and congestion in your nose or some drainage.  This is from the oxygen used during your procedure.  There is no need for concern and it should clear up in a day or so. ? ?SYMPTOMS TO REPORT IMMEDIATELY: ? ?Following lower endoscopy (colonoscopy or flexible sigmoidoscopy): ? Excessive amounts of blood in the stool ? Significant tenderness or worsening of abdominal pains ? Swelling of the abdomen that is new, acute ? Fever of 100?F or higher ? ?Following upper endoscopy (EGD) ? Vomiting of blood or coffee ground material ? New chest pain or pain under the shoulder blades ? Painful or persistently difficult swallowing ? New shortness of breath ? Fever of 100?F or higher ? Black, tarry-looking stools ? ?For urgent  or emergent issues, a gastroenterologist can be reached at any hour by calling [phone]. ?Do not use MyChart messaging for urgent concerns.  ? ? ?DIET:  We do recommend a small meal at first, but then you may proceed to your regular diet.  Drink plenty of fluids but you should avoid alcoholic beverages for 24 hours. ? ?ACTIVITY:  You should plan to take it easy for the rest of today and you should NOT DRIVE or use heavy machinery until tomorrow (because of the sedation medicines used during the test).   ? ?FOLLOW UP: ?Our staff will call the number listed on your records 48-72 hours following your procedure to check on you and address any questions or concerns that you may have regarding the information given to you following your procedure. If we do not reach you, we will leave a message.  We will attempt to reach you two times.  During this call, we will ask if you have developed any symptoms of COVID 19. If you develop any symptoms (ie: fever, flu-like symptoms, shortness of breath, cough etc.) before then, please call [phone].  If you test positive for Covid 19 in the 2 weeks post procedure, please call and report this information to [place].   ? ?If any biopsies were taken you will be contacted by phone or by letter within the next 1-3 weeks.  Please call us at [phone] if you have not heard about the biopsies in 3 weeks.  ? ? ?SIGNATURES/CONFIDENTIALITY: ?You and/or your care partner have signed  paperwork which will be entered into your electronic medical record.  These signatures attest to the fact that that the information above on your After Visit Summary has been reviewed and is understood.  Full responsibility of the confidentiality of this discharge information lies with you and/or your care-partner.  ?

## 2022-02-16 NOTE — Progress Notes (Signed)
? ?Referring Provider: Suzan Slick, MD ?Primary Care Physician:  Suzan Slick, MD ? ?Indication for EGD:  Cough, globus, nausea ?Indication for Colonoscopy: Colon cancer screening ? ?                   ? ?PLAN: ?- EGD including esophageal and gastric biopsies ?- Colonoscopy ?- ENT referral if endoscopic evaluation is nondiagnostic ? ?She is an appropriate candidate for monitored anesthesia care in the [facility] ? ? ? ? ?HPI: Morgan Rivera is a 56 y.o. female who presents for EGD and colonoscopy.  She came in for colonoscopy 11/21/2021.  During the admitting process she began coughing which triggered vomiting.  Patient reported that food gets stuck in her tonsils and causes her to cough at least weekly.  The nurse anesthetist was concerned about the symptoms and felt that they needed to be evaluated prior to proceeding with monitored anesthesia care.  She presents today remaining motivated to proceed with colonoscopy. ? ?Prior history is significant for diabetes, obstructive sleep apnea on CPAP, obesity, hypertension, and migraines.  She had a cholecystectomy with post-procedure diarrhea that ultimately resolved. ? ?She had genetic counseling performed in 2020 given a strong family history of cancer.  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  She is not affected with MYH associated polyposis. ?There is no known family history of colon cancer or polyps. No family history of stomach cancer or other GI malignancy. No family history of inflammatory bowel disease or celiac.  ? ?3 months ago she had a cold with a prominent cough. She did not see a doctor at that time. Since then she feels that there is something in her throat - primarily on the right but occasionally on the left. There is associated nausea and vomiting. Frequent throat clearing. No dysphonia or sore throat.  No dysphagia or odynophagia. No change in bowel habits. No blood in the stool.  ? ?She has a longstanding history of  constipation. She uses Miralax every other day with her coffee.  ? ?Colonoscopy in 2015 at [facility]. She had diverticulosis but no polyps.  ? ?No smoking. No marijuana.  ? ?Prior abdominal imaging: ?- Abdominal ultrasound 2004 for nausea and vomiting: Cholelithiasis, probable fatty liver ?- Upper GI series with K[facility] 2015 for bariatric screening: Widely patent Schatzki's ring, otherwise normal ? ? ?Past Medical History:  ?Diagnosis Date  ? Allergy   ? rosemary, pollen, seasonal  ? Anxiety   ? Bronchitis 2020  ? Depression   ? Diabetes mellitus without complication (HCC)   ? Family history of breast cancer   ? Family history of breast cancer   ? Family history of melanoma   ? Family history of prostate cancer   ? Family history of uterine cancer   ? GERD (gastroesophageal reflux disease)   ? Hyperlipidemia   ? Hypertension   ? Migraines   ? Prediabetes   ? Sleep apnea   ? ? ?Past Surgical History:  ?Procedure Laterality Date  ? BREATH TEK H PYLORI N/A 05/25/2014  ? Procedure: BREATH TEK H PYLORI;  Surgeon: Kandis Cocking, MD;  Location: [facility];  Service: General;  Laterality: N/A;  ? CARPAL TUNNEL RELEASE    ? CHOLECYSTECTOMY    ? COLONOSCOPY    ? FOOT BONE EXCISION    ? SHOULDER SURGERY  2016  ? frozen shoulder  ? TUBAL LIGATION    ? ?Current Outpatient Medications  ?Medication Sig  Dispense Refill  ? atorvastatin (LIPITOR) 10 MG tablet Take 10 mg by mouth daily.    ? citalopram (CELEXA) 40 MG tablet Take 1 tablet (40 mg total) by mouth daily. 90 tablet 0  ? meloxicam (MOBIC) 7.5 MG tablet meloxicam 7.5 mg tablet    ? metFORMIN (GLUCOPHAGE) 500 MG tablet Take 1 tablet (500 mg total) by mouth 2 (two) times daily with a meal. 180 tablet 1  ? nortriptyline (PAMELOR) 25 MG capsule Take 25 mg by mouth 2 (two) times daily.    ? pantoprazole (PROTONIX) 40 MG tablet Take 1 tablet (40 mg total) by mouth in the morning. 30 tablet 3  ? quinapril (ACCUPRIL) 40 MG tablet TAKE ONE TABLET BY MOUTH AT BEDTIME 60 tablet 2  ?  predniSONE (DELTASONE) 20 MG tablet Take 2 tablets (40 mg total) by mouth daily. 10 tablet 0  ? ?Current Facility-Administered Medications  ?Medication Dose Route Frequency Provider Last Rate Last Admin  ? 0.9 %  sodium chloride infusion  500 mL Intravenous Once Tressia Danas, MD      ? 0.9 %  sodium chloride infusion  500 mL Intravenous Once Tressia Danas, MD      ? ? ?Allergies as of 02/16/2022 - Review Complete 02/16/2022  ?Allergen Reaction Noted  ? Food  09/01/2011  ? Hydrocodone  11/21/2013  ? ? ?Family History  ?Problem Relation Age of Onset  ? Colon polyps Mother   ? Diabetes Mother 44  ? Cancer Mother   ?     hx of melanoma  ? Heart disease Mother   ? Hypertension Mother   ? Other Mother   ?     heterozygote MUTYH  ? Diabetes Father 36  ? Cancer Father 90  ?     prostate  ? Breast cancer Maternal Aunt   ? Other Maternal Aunt   ?     heterozygote MUTYH  ? Breast cancer Maternal Aunt 61  ? Melanoma Maternal Aunt   ? Other Maternal Aunt   ?     heterozygote MUTYH  ? Lung cancer Maternal Aunt   ? Prostate cancer Paternal Uncle   ? Prostate cancer Paternal Uncle   ? Prostate cancer Paternal Uncle   ? Melanoma Maternal Grandmother   ? Heart disease Maternal Grandfather   ? Diabetes Maternal Grandfather   ? Diabetes Paternal Grandmother   ? Prostate cancer Paternal Grandfather   ? Uterine cancer Cousin 35  ? Prostate cancer Cousin   ?     pat cousin  ? Prostate cancer Cousin   ?     pat cousin  ? Breast cancer Other   ?     MGMs sister  ? Stomach cancer Other   ?     MGM's brother  ? Colon cancer Neg Hx   ? Esophageal cancer Neg Hx   ? Ulcerative colitis Neg Hx   ? ? ? ? ?Physical Exam: ?General:   Alert,  well-nourished, pleasant and cooperative in NAD ?Head:  Normocephalic and atraumatic. ?Eyes:  Sclera clear, no icterus.   Conjunctiva pink. ?Ears:  Normal auditory acuity. ?Nose:  No deformity, discharge,  or lesions. ?Mouth:  No deformity or lesions.   ?Neck:  Supple; no masses or thyromegaly. ?Lungs:   Clear throughout to auscultation.   No wheezes. ?Heart:  Regular rate and rhythm; no murmurs. ?Abdomen:  Soft, nontender, nondistended, normal bowel sounds, no rebound or guarding. No hepatosplenomegaly.   ?Rectal:  Deferred  ?Msk:  Symmetrical. No boney deformities ?LAD: No inguinal or umbilical LAD ?Extremities:  No clubbing or edema. ?Neurologic:  Alert and  oriented x4;  grossly nonfocal ?Skin:  Intact without significant lesions or rashes. ?Psych:  Alert and cooperative. Normal mood and affect. ? ? ? ? ?Morgan Glacken L. Orvan Falconer, MD, MPH ?02/16/2022, 11:02 AM ? ? ? ?  ?

## 2022-02-16 NOTE — Op Note (Signed)
[facility] ?Patient Name: Morgan Rivera ?Procedure Date: 02/16/2022 11:06 AM ?MRN: [id number] ?Endoscopist: Tressia Danas MD, MD ?Age: 56 ?Referring MD:  ?Date of Birth: December 24, 1965 ?Gender: Female ?Account #: [id number] ?Procedure:                Colonoscopy ?Indications:              Screening for colorectal malignant neoplasm, This  ?                          is the patient's first colonoscopy ?                          One pathogenic mutation in MUTYH (carrier) ?                          No known family history of colon cancer or polyps ?Medicines:                Monitored Anesthesia Care ?Procedure:                Pre-Anesthesia Assessment: ?                          - Prior to the procedure, a History and Physical  ?                          was performed, and patient medications and  ?                          allergies were reviewed. The patient's tolerance of  ?                          previous anesthesia was also reviewed. The risks  ?                          and benefits of the procedure and the sedation  ?                          options and risks were discussed with the patient.  ?                          All questions were answered, and informed consent  ?                          was obtained. Prior Anticoagulants: The patient has  ?                          taken no previous anticoagulant or antiplatelet  ?                          agents. ASA Grade Assessment: III - A patient with  ?                          severe systemic disease. After reviewing the risks  ?  and benefits, the patient was deemed in  ?                          satisfactory condition to undergo the procedure. ?                          After obtaining informed consent, the colonoscope  ?                          was passed under direct vision. Throughout the  ?                          procedure, the patient's blood pressure, pulse, and  ?                          oxygen saturations were  monitored continuously. The  ?                          Olympus CF-[id number] (#[id number]) Colonoscope was  ?                          introduced through the anus and advanced to the 3  ?                          cm into the ileum. The colonoscopy was performed  ?                          without difficulty. The patient tolerated the  ?                          procedure well. The quality of the bowel  ?                          preparation was good. The terminal ileum, ileocecal  ?                          valve, appendiceal orifice, and rectum were  ?                          photographed. ?Scope In: 11:28:53 AM ?Scope Out: 11:39:13 AM ?Scope Withdrawal Time: 0 hours 8 minutes 3 seconds  ?Total Procedure Duration: 0 hours 10 minutes 20 seconds  ?Findings:                 Non-bleeding external and internal hemorrhoids were  ?                          found. ?                          The entire examined colon appeared normal on direct  ?                          and retroflexion views. ?Complications:            No immediate complications. ?Estimated Blood Loss:  Estimated blood loss: none. ?Impression:               - Non-bleeding external and internal hemorrhoids. ?                          - The entire examined colon is normal on direct and  ?                          retroflexion views. ?                          - No specimens collected. ?Recommendation:           - Patient has a contact number available for  ?                          emergencies. The signs and symptoms of potential  ?                          delayed complications were discussed with the  ?                          patient. Return to normal activities tomorrow.  ?                          Written discharge instructions were provided to the  ?                          patient. ?                          - Resume previous diet. ?                          - Continue present medications. ?                          - Repeat colonoscopy in 5 years  for surveillance  ?                          given the genetic mutation. earlier with new  ?                          symptoms. ?                          - Emerging evidence supports eating a diet of  ?                          fruits, vegetables, grains, calcium, and yogurt  ?                          while reducing red meat and alcohol may reduce the  ?                          risk of colon cancer. ?                          -  Thank you for allowing me to be involved in your  ?                          colon cancer prevention. ?Tressia Danas MD, MD ?02/16/2022 11:44:58 AM ?This report has been signed electronically. ?

## 2022-02-16 NOTE — Progress Notes (Signed)
Called to room to assist during endoscopic procedure.  Patient ID and intended procedure confirmed with present staff. Received instructions for my participation in the procedure from the performing physician.  

## 2022-02-16 NOTE — Progress Notes (Signed)
A and O x3. Report to RN. Tolerated MAC anesthesia well. Teeth unchanged after procedure.  ?

## 2022-02-16 NOTE — Progress Notes (Signed)
C.W. vital signs. 

## 2022-02-16 NOTE — Op Note (Signed)
[facility] ?Patient Name: Morgan Rivera ?Procedure Date: 02/16/2022 11:07 AM ?MRN: [id number] ?Endoscopist: Tressia Danas MD, MD ?Age: 56 ?Referring MD:  ?Date of Birth: 12-Nov-1966 ?Gender: Female ?Account #: [id number] ?Procedure:                Upper GI endoscopy ?Indications:              Chronic cough, Globus sensation, Nausea ?Medicines:                Monitored Anesthesia Care ?Procedure:                Pre-Anesthesia Assessment: ?                          - Prior to the procedure, a History and Physical  ?                          was performed, and patient medications and  ?                          allergies were reviewed. The patient's tolerance of  ?                          previous anesthesia was also reviewed. The risks  ?                          and benefits of the procedure and the sedation  ?                          options and risks were discussed with the patient.  ?                          All questions were answered, and informed consent  ?                          was obtained. Prior Anticoagulants: The patient has  ?                          taken no previous anticoagulant or antiplatelet  ?                          agents. ASA Grade Assessment: III - A patient with  ?                          severe systemic disease. After reviewing the risks  ?                          and benefits, the patient was deemed in  ?                          satisfactory condition to undergo the procedure. ?                          After obtaining informed consent, the endoscope was  ?  passed under direct vision. Throughout the  ?                          procedure, the patient's blood pressure, pulse, and  ?                          oxygen saturations were monitored continuously. The  ?                          GIF HQ190 #[id number] was introduced through the  ?                          mouth, and advanced to the third part of duodenum.  ?                          The upper  GI endoscopy was accomplished without  ?                          difficulty. The patient tolerated the procedure  ?                          well. ?Scope In: ?Scope Out: ?Findings:                 The examined esophagus was normal except for a  ?                          slightly irregular z-line located 36 cm from the  ?                          incisors. Biopsies were obtained from the  ?                          mid/proximal and distal esophagus with cold forceps  ?                          for histology of suspected eosinophilic esophagitis. ?                          Localized minimal inflammation characterized by  ?                          erythema, friability and granularity was found in  ?                          the gastric fundus. Biopsies were taken from the  ?                          antrum, body, and fundus with a cold forceps for  ?                          histology. Estimated blood loss was minimal. ?                          The examined  duodenum was normal. ?                          The cardia and gastric fundus were normal on  ?                          retroflexion. ?                          The exam was otherwise without abnormality. ?Complications:            No immediate complications. Estimated blood loss:  ?                          Minimal. ?Estimated Blood Loss:     Estimated blood loss was minimal. ?Impression:               - Normal esophagus. Biopsied. ?                          - Gastritis. Biopsied. ?                          - Normal examined duodenum. ?                          - The examination was otherwise normal. ?Recommendation:           - Patient has a contact number available for  ?                          emergencies. The signs and symptoms of potential  ?                          delayed complications were discussed with the  ?                          patient. Return to normal activities tomorrow.  ?                          Written discharge instructions were  provided to the  ?                          patient. ?                          - Resume previous diet. ?                          - Continue present medications. ?                          - No aspirin, ibuprofen, naproxen, or other  ?                          non-steroidal anti-inflammatory drugs. ?                          - Await pathology results. ?  Tressia Danas MD, MD ?02/16/2022 11:40:32 AM ?This report has been signed electronically. ?

## 2022-02-16 NOTE — Progress Notes (Signed)
Pt's states no medical or surgical changes since previsit or office visit. 

## 2022-02-20 ENCOUNTER — Telehealth: Payer: Self-pay

## 2022-02-20 NOTE — Telephone Encounter (Signed)
?  Follow up Call- ? ?Call back number 02/16/2022  ?Post procedure Call Back phone  # [phone]  ?Permission to leave phone message Yes  ?Some recent data might be hidden  ?  ? ?Patient questions: ? ?Do you have a fever, pain , or abdominal swelling? No. ?Pain Score  0 * ? ?Have you tolerated food without any problems? Yes.   ? ?Have you been able to return to your normal activities? Yes.   ? ?Do you have any questions about your discharge instructions: ?Diet   No. ?Medications  No. ?Follow up visit  No. ? ?Do you have questions or concerns about your Care? No. ? ?Actions: ?* If pain score is 4 or above: ?No action needed, pain <4. ? ?Have you developed a fever since your procedure? no ? ?2.   Have you had an respiratory symptoms (SOB or cough) since your procedure? no ? ?3.   Have you tested positive for COVID 19 since your procedure no ? ?4.   Have you had any family members/close contacts diagnosed with the COVID 19 since your procedure?  no ? ? ?If yes to any of these questions please route to Laverna Peace, RN and Karlton Lemon, RN ? ? ? ?

## 2022-02-23 ENCOUNTER — Encounter: Payer: Self-pay | Admitting: Gastroenterology

## 2022-03-09 ENCOUNTER — Telehealth: Payer: Self-pay | Admitting: Gastroenterology

## 2022-03-09 NOTE — Telephone Encounter (Signed)
Inbound call from patient seeking advice if her Protonix can be switched to a higher dose. Please advise. ?

## 2022-03-09 NOTE — Telephone Encounter (Signed)
EGD 02/17/19 showed reflux. Recommendation was to continue current medications and to schedule f/u appt. Pt has not scheduled f/u appt at the time of this entry. Called pt to inquire further. Confirmed she is taking Pantoprazole qd. States she is having worsening epigastric pain, especially at night. States she is also having worsening reflux. Denies N/V or s/sx of dysphagia. States she trying to adhere to reflux diet/lifestyle changes and tries to sleep elevated to reduce her symptoms at night. Advised Dr. Tarri Glenn is out of the office today and will return tomorrow. Will route this message to Dr. Tarri Glenn to determine if she would like pt to increase Pantoprazole to BID, possibly add Pepcid qhs prn. Pt has been scheduled to see Nicoletta Ba, PA-C 03/15/22 @ 130pm to further evaluate symptoms. ?

## 2022-03-10 ENCOUNTER — Other Ambulatory Visit: Payer: Self-pay

## 2022-03-10 DIAGNOSIS — K219 Gastro-esophageal reflux disease without esophagitis: Secondary | ICD-10-CM

## 2022-03-10 MED ORDER — PANTOPRAZOLE SODIUM 40 MG PO TBEC: 40.0000 mg | DELAYED_RELEASE_TABLET | Freq: Two times a day (BID) | ORAL | 2 refills | Status: DC

## 2022-03-10 NOTE — Telephone Encounter (Signed)
New Rx sent to pt preferred pharmacy. Called pt to make her aware of Dr. Tarri Glenn recommendation and that her Rx has been sent to her preferred pharmacy. Advised to keep appt as scheduled. Verbalized acceptance and understanding. ?

## 2022-03-15 ENCOUNTER — Ambulatory Visit: Payer: Medicare HMO | Admitting: Physician Assistant

## 2022-03-15 ENCOUNTER — Other Ambulatory Visit (INDEPENDENT_AMBULATORY_CARE_PROVIDER_SITE_OTHER): Payer: Medicare HMO

## 2022-03-15 ENCOUNTER — Encounter: Payer: Self-pay | Admitting: Physician Assistant

## 2022-03-15 VITALS — BP 110/80 | HR 76 | Wt 245.0 lb

## 2022-03-15 DIAGNOSIS — R112 Nausea with vomiting, unspecified: Secondary | ICD-10-CM

## 2022-03-15 DIAGNOSIS — K219 Gastro-esophageal reflux disease without esophagitis: Secondary | ICD-10-CM

## 2022-03-15 DIAGNOSIS — R1013 Epigastric pain: Secondary | ICD-10-CM

## 2022-03-15 LAB — CBC WITH DIFFERENTIAL/PLATELET
Basophils Absolute: 0.1 10*3/uL (ref 0.0–0.1)
Basophils Relative: 0.8 % (ref 0.0–3.0)
Eosinophils Absolute: 0.3 10*3/uL (ref 0.0–0.7)
Eosinophils Relative: 3.2 % (ref 0.0–5.0)
HCT: 38.4 % (ref 36.0–46.0)
Hemoglobin: 12.9 g/dL (ref 12.0–15.0)
Lymphocytes Relative: 25 % (ref 12.0–46.0)
Lymphs Abs: 2.2 10*3/uL (ref 0.7–4.0)
MCHC: 33.6 g/dL (ref 30.0–36.0)
MCV: 85.2 fl (ref 78.0–100.0)
Monocytes Absolute: 0.5 10*3/uL (ref 0.1–1.0)
Monocytes Relative: 5.1 % (ref 3.0–12.0)
Neutro Abs: 5.9 10*3/uL (ref 1.4–7.7)
Neutrophils Relative %: 65.9 % (ref 43.0–77.0)
Platelets: 260 10*3/uL (ref 150.0–400.0)
RBC: 4.51 Mil/uL (ref 3.87–5.11)
RDW: 13.1 % (ref 11.5–15.5)
WBC: 8.9 10*3/uL (ref 4.0–10.5)

## 2022-03-15 LAB — LIPASE: Lipase: 16 U/L (ref 11.0–59.0)

## 2022-03-15 LAB — COMPREHENSIVE METABOLIC PANEL
ALT: 15 U/L (ref 0–35)
AST: 13 U/L (ref 0–37)
Albumin: 4.2 g/dL (ref 3.5–5.2)
Alkaline Phosphatase: 74 U/L (ref 39–117)
BUN: 12 mg/dL (ref 6–23)
CO2: 29 mEq/L (ref 19–32)
Calcium: 9.3 mg/dL (ref 8.4–10.5)
Chloride: 100 mEq/L (ref 96–112)
Creatinine, Ser: 0.69 mg/dL (ref 0.40–1.20)
GFR: 96.14 mL/min (ref >60.00)
Glucose, Bld: 220 mg/dL — ABNORMAL HIGH (ref 70–99)
Potassium: 3.9 mEq/L (ref 3.5–5.1)
Sodium: 138 mEq/L (ref 135–145)
Total Bilirubin: 0.4 mg/dL (ref 0.2–1.2)
Total Protein: 6.9 g/dL (ref 6.0–8.3)

## 2022-03-15 LAB — SEDIMENTATION RATE: Sed Rate: 19 mm/hr (ref 0–30)

## 2022-03-15 MED ORDER — PANTOPRAZOLE SODIUM 40 MG PO TBEC: 40.0000 mg | DELAYED_RELEASE_TABLET | Freq: Every day | ORAL | 1 refills | Status: DC

## 2022-03-15 MED ORDER — SUCRALFATE 1 GM/10ML PO SUSP: 1.0000 g | Freq: Three times a day (TID) | ORAL | 1 refills | Status: AC

## 2022-03-15 MED ORDER — ONDANSETRON HCL 4 MG PO TABS: 4.0000 mg | ORAL_TABLET | Freq: Four times a day (QID) | ORAL | 1 refills | Status: AC | PRN

## 2022-03-15 NOTE — Progress Notes (Signed)
? ?Subjective:  ? ? Patient ID: Morgan Rivera, female    DOB: 03/17/1966, 56 y.o.   MRN: [id number] ? ?HPI ?Morgan is a 56 year old white female, established with Dr. Tarri Glenn, who comes in today with complaints of epigastric burning, intermittent nausea and vomiting and concerns with worsening GERD symptoms after recent EGD. ?Patient had undergone EGD and colonoscopy on 02/16/2022 after initial visit with Dr. Tarri Glenn in January 2023. ?She has a heterozygous MUTYH mutation.  Colonoscopy revealed nonbleeding internal and external hemorrhoids and was otherwise negative.  Plan will be for every 5 year screening. ?EGD was done for globus sensation, sense of fullness or something being in the throat particularly on the right side which can cause episodes of coughing that lead to nausea and vomiting. ?EGD showed a normal-appearing esophagus, minimal gastritis.  Biopsies from the distal esophagus showed reactive changes consistent with reflux esophagitis, no eosinophilia.  Biopsies from the midesophagus showed mildly reactive squamous mucosa and gastric biopsies with reactive gastropathy no H. pylori or intestinal metaplasia. ?She had been started on pantoprazole 40 mg once daily in January 2023.  She does feel that she has had some improvement in the globus type symptoms. ?She had called the office on 03/10/2022 with concerns for burning, nausea and vomiting and pantoprazole was increased to twice daily. ?She feels she has had some increase in epigastric burning over the past couple of weeks, no real complaints of heartburn or indigestion and no dysphagia or odynophagia.  About 3 days ago she had nausea and vomiting associated with her symptoms and then vomited again last evening after eating some chips. ?No associated fever, myalgias, she has chronic loose stools once daily postcholecystectomy with no change in that pattern. ?Says she is feeling better today. ?On long-term meloxicam. ?Medical history pertinent for  hypertension, morbid obesity, anxiety disorder and adult onset diabetes mellitus.  She is status postcholecystectomy remotely ? ?Review of Systems Pertinent positive and negative review of systems were noted in the above HPI section.  All other review of systems was otherwise negative.  ? ?Outpatient Encounter Medications as of 03/15/2022  ?Medication Sig  ? atorvastatin (LIPITOR) 10 MG tablet Take 10 mg by mouth daily.  ? citalopram (CELEXA) 40 MG tablet Take 1 tablet (40 mg total) by mouth daily.  ? meloxicam (MOBIC) 7.5 MG tablet meloxicam 7.5 mg tablet  ? metFORMIN (GLUCOPHAGE) 500 MG tablet Take 1 tablet (500 mg total) by mouth 2 (two) times daily with a meal.  ? nortriptyline (PAMELOR) 25 MG capsule Take 25 mg by mouth 2 (two) times daily.  ? ondansetron (ZOFRAN) 4 MG tablet Take 1 tablet (4 mg total) by mouth every 6 (six) hours as needed for nausea or vomiting.  ? quinapril (ACCUPRIL) 40 MG tablet TAKE ONE TABLET BY MOUTH AT BEDTIME  ? sucralfate (CARAFATE) 1 GM/10ML suspension Take 10 mLs (1 g total) by mouth 4 (four) times daily -  with meals and at bedtime for 14 days.  ? [DISCONTINUED] pantoprazole (PROTONIX) 40 MG tablet Take 1 tablet (40 mg total) by mouth 2 (two) times daily.  ? pantoprazole (PROTONIX) 40 MG tablet Take 1 tablet (40 mg total) by mouth daily.  ? [DISCONTINUED] predniSONE (DELTASONE) 20 MG tablet Take 2 tablets (40 mg total) by mouth daily.  ? ?Facility-Administered Encounter Medications as of 03/15/2022  ?Medication  ? 0.9 %  sodium chloride infusion  ? ?Allergies  ?Allergen Reactions  ? Food   ?  Rosemary, scratchy sore throat  ?  ?  Hydrocodone   ?  Vertigo, nausea, vomiting  ? ?Patient Active Problem List  ? Diagnosis Date Noted  ? Genetic testing 02/13/2019  ? Family history of breast cancer   ? Family history of prostate cancer   ? Family history of melanoma   ? Family history of uterine cancer   ? 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 Type 2 diabetes mellitus ([place])  01/20/2016  ? Essential hypertension 12/15/2015  ? GAD (generalized anxiety disorder) 12/15/2015  ? Prediabetes 12/15/2015  ? Morbid obesity ([place]) 05/06/2014  ? ?Social History  ? ?Socioeconomic History  ? Marital status: Married  ?  Spouse name: Not on file  ? Number of children: 1  ? Years of education: 22  ? Highest education level: Not on file  ?Occupational History  ? Occupation: Unemployed  ?Tobacco Use  ? Smoking status: Never  ? Smokeless tobacco: Never  ?Vaping Use  ? Vaping Use: Never used  ?Substance and Sexual Activity  ? Alcohol use: Never  ? Drug use: Never  ? Sexual activity: Yes  ?  Birth control/protection: Surgical, Post-menopausal  ?Other Topics Concern  ? Not on file  ?Social History Narrative  ? Fun: Crafts and music  ? Denies abuse and feels safe at home.   ? ?Social Determinants of Health  ? ?Financial Resource Strain: Not on file  ?Food Insecurity: Not on file  ?Transportation Needs: Not on file  ?Physical Activity: Not on file  ?Stress: Not on file  ?Social Connections: Not on file  ?Intimate Partner Violence: Not on file  ? ? ?[name]'s family history includes Breast cancer in her maternal aunt and another family member; Breast cancer (age of onset: 65) in her maternal aunt; Cancer in her mother; Cancer (age of onset: 42) in her father; Colon polyps in her mother; Diabetes in her maternal grandfather and paternal grandmother; Diabetes (age of onset: 16) in her mother; Diabetes (age of onset: 54) in her father; Heart disease in her maternal grandfather and mother; Hypertension in her mother; Lung cancer in her maternal aunt; Melanoma in her maternal aunt and maternal grandmother; Other in her maternal aunt, maternal aunt, and mother; Prostate cancer in her cousin, cousin, paternal grandfather, paternal uncle, paternal uncle, and paternal uncle; Stomach cancer in an other family member; Uterine cancer (age of onset: 77) in her cousin. ? ? ?   ?Objective:  ?  ?Vitals:  ? 03/15/22 1336  ?BP:  110/80  ?Pulse: 76  ? ? ?Physical Exam Well-developed well-nourished WF  in no acute distress.  [id number]  BMI 46.2 ? ?HEENT; nontraumatic normocephalic, EOMI, PE R LA, sclera anicteric. ?Oropharynx; not examined today ?Neck; supple, no JVD ?Cardiovascular; regular rate and rhythm with S1-S2, no murmur rub or gallop ?Pulmonary; Clear bilaterally ?Abdomen; soft, obese nontender, nondistended, no palpable mass or hepatosplenomegaly, bowel sounds are active ?Rectal; not done today ?Skin; benign exam, no jaundice rash or appreciable lesions ?Extremities; no clubbing cyanosis or edema skin warm and dry ?Neuro/Psych; alert and oriented x4, grossly nonfocal mood and affect appropriate  ? ? ? ?   ?Assessment & Plan:  ? ?#70 56 year old white female with complaints of epigastric burning, over the past couple of weeks and a couple of episodes of nausea and vomiting over the past week. ?Patient has been on pantoprazole 40 mg once daily over the past 2 months, with some improvement in previously described globus symptoms. ?Increase to twice daily PPI about 4 days ago with no change in the  symptoms ? ?I do not think her current symptoms are secondary to reflux esophagitis or worsening GERD ?Rule out a mild viral gastroenteritis, consider medication side effect, consider underlying gastroparesis,functional dyspepsia ?Patient is status post cholecystectomy ? ?#2 colon cancer screening-negative colonoscopy March 2023 ?#3 MUTYH heterozygote-plan is for q. 5-year screening colonoscopies ?#4 obesity ?#5.  Adult onset diabetes mellitus ?#6.  Hypertension ? ?Plan; we will decrease pantoprazole back to 40 mg p.o. every morning AC breakfast ?Start Zofran 4 mg every 6 hours as needed for nausea ?Start 2-week course of Carafate liquid 1 g between meals and at bedtime ?Patient was given an antireflux diet and antireflux precautions ?Check CBC with differential, c-Met and lipase today ?Bland diet ?Patient is asked to call back in 1 week if  she has not had significant improvement in symptoms. ? ?Alfredia Ferguson PA-C ?03/15/2022 ? ? ?Cc: Leeanne Rio, MD ?  ?

## 2022-03-15 NOTE — Patient Instructions (Addendum)
If you are age 58 or younger, your body mass index should be between 19-25. Your Body mass index is 46.29 kg/m?Marland Kitchen If this is out of the aformentioned range listed, please consider follow up with your Primary Care Provider.  ?________________________________________________________ ? ?The [facility] providers would like to encourage you to use [facility] to communicate with providers for non-urgent requests or questions.  Due to long hold times on the telephone, sending your provider a message by [facility] may be a faster and more efficient way to get a response.  Please allow 48 business hours for a response.  Please remember that this is for non-urgent requests.  ?_______________________________________________________ ? ?Your provider has requested that you go to the basement level for lab work before leaving today. Press "B" on the elevator. The lab is located at the first door on the left as you exit the elevator. ? ?Decrease your Pantoprazole to once daily ? ?START Ondansetron 4 mg 1 tablet every 6 hours as needed for nausea and vomiting ? ?START Sucralfate 1 gram between meals and at bedtime. ? ?Follow anti-reflux sheet and GERD Diet. ? ?Call the office in one week and speak with Dr. Daleen Bo nurse, [place] if you are not feeling better. ? ?Thank you for entrusting me with your care and choosing [facility]. ? ?Mike Gip, PA-C ?

## 2022-03-16 NOTE — Progress Notes (Signed)
Reviewed and agree with management plans. ? ?Emrah Ariola L. Georgios Kina, MD, MPH  ?

## 2022-03-29 ENCOUNTER — Other Ambulatory Visit: Payer: Self-pay | Admitting: Gastroenterology

## 2022-03-29 DIAGNOSIS — K219 Gastro-esophageal reflux disease without esophagitis: Secondary | ICD-10-CM

## 2022-06-09 ENCOUNTER — Other Ambulatory Visit: Payer: Self-pay | Admitting: Gastroenterology

## 2022-06-09 DIAGNOSIS — K219 Gastro-esophageal reflux disease without esophagitis: Secondary | ICD-10-CM

## 2022-09-07 ENCOUNTER — Other Ambulatory Visit: Payer: Self-pay | Admitting: Gastroenterology

## 2022-09-07 DIAGNOSIS — K219 Gastro-esophageal reflux disease without esophagitis: Secondary | ICD-10-CM

## 2024-11-19 ENCOUNTER — Other Ambulatory Visit (HOSPITAL_BASED_OUTPATIENT_CLINIC_OR_DEPARTMENT_OTHER): Payer: Self-pay

## 2024-11-19 MED ORDER — MELOXICAM 15 MG PO TABS
15.0000 mg | ORAL_TABLET | Freq: Every day | ORAL | 2 refills | Status: AC
  Filled 2024-11-19: qty 90, 90d supply, fill #0
  Filled 2025-01-15: qty 90, 90d supply, fill #1

## 2024-11-19 MED ORDER — NORTRIPTYLINE HCL 25 MG PO CAPS
75.0000 mg | ORAL_CAPSULE | Freq: Every day | ORAL | 0 refills | Status: AC
  Filled 2024-11-19: qty 180, 60d supply, fill #0

## 2024-11-19 MED ORDER — ROSUVASTATIN CALCIUM 10 MG PO TABS
10.0000 mg | ORAL_TABLET | Freq: Every day | ORAL | 2 refills | Status: AC
  Filled 2024-11-19: qty 90, 90d supply, fill #0

## 2024-11-19 MED ORDER — CITALOPRAM HYDROBROMIDE 10 MG PO TABS
10.0000 mg | ORAL_TABLET | Freq: Every day | ORAL | 2 refills | Status: AC
  Filled 2024-11-19: qty 90, 90d supply, fill #0
  Filled 2025-01-15: qty 90, 90d supply, fill #1

## 2024-11-20 ENCOUNTER — Other Ambulatory Visit (HOSPITAL_BASED_OUTPATIENT_CLINIC_OR_DEPARTMENT_OTHER): Payer: Self-pay

## 2025-01-15 ENCOUNTER — Other Ambulatory Visit (HOSPITAL_COMMUNITY): Payer: Self-pay

## 2025-01-15 ENCOUNTER — Other Ambulatory Visit: Payer: Self-pay

## 2025-01-19 ENCOUNTER — Other Ambulatory Visit (HOSPITAL_BASED_OUTPATIENT_CLINIC_OR_DEPARTMENT_OTHER): Payer: Self-pay

## 2025-01-19 MED ORDER — METFORMIN HCL 500 MG PO TABS
ORAL_TABLET | ORAL | 1 refills | Status: AC
  Filled 2025-01-19: qty 360, 90d supply, fill #0

## 2025-01-19 MED ORDER — LISINOPRIL 40 MG PO TABS
40.0000 mg | ORAL_TABLET | Freq: Every day | ORAL | 1 refills | Status: AC
  Filled 2025-01-19: qty 90, 90d supply, fill #0
